# Patient Record
Sex: Male | Born: 1940
Health system: Southern US, Community
[De-identification: ages and names within clinical notes are randomized; demographics above are authoritative.]

## PROBLEM LIST (undated history)

## (undated) DIAGNOSIS — E785 Hyperlipidemia, unspecified: Secondary | ICD-10-CM

## (undated) DIAGNOSIS — I10 Essential (primary) hypertension: Secondary | ICD-10-CM

## (undated) DIAGNOSIS — K644 Residual hemorrhoidal skin tags: Secondary | ICD-10-CM

## (undated) DIAGNOSIS — K222 Esophageal obstruction: Secondary | ICD-10-CM

## (undated) DIAGNOSIS — M1990 Unspecified osteoarthritis, unspecified site: Secondary | ICD-10-CM

## (undated) DIAGNOSIS — K219 Gastro-esophageal reflux disease without esophagitis: Secondary | ICD-10-CM

## (undated) DIAGNOSIS — K429 Umbilical hernia without obstruction or gangrene: Secondary | ICD-10-CM

## (undated) DIAGNOSIS — I2510 Atherosclerotic heart disease of native coronary artery without angina pectoris: Secondary | ICD-10-CM

## (undated) DIAGNOSIS — Z87891 Personal history of nicotine dependence: Secondary | ICD-10-CM

## (undated) DIAGNOSIS — Z7289 Other problems related to lifestyle: Secondary | ICD-10-CM

## (undated) DIAGNOSIS — E878 Other disorders of electrolyte and fluid balance, not elsewhere classified: Secondary | ICD-10-CM

## (undated) DIAGNOSIS — K2270 Barrett's esophagus without dysplasia: Secondary | ICD-10-CM

## (undated) HISTORY — DX: Umbilical hernia without obstruction or gangrene: K42.9

## (undated) HISTORY — DX: Essential (primary) hypertension: I10

## (undated) HISTORY — DX: Residual hemorrhoidal skin tags: K64.4

## (undated) HISTORY — PX: TONSILLECTOMY: SUR1361

## (undated) HISTORY — PX: APPENDECTOMY: SHX54

## (undated) HISTORY — DX: Other disorders of electrolyte and fluid balance, not elsewhere classified: E87.8

## (undated) HISTORY — DX: Other problems related to lifestyle: Z72.89

## (undated) HISTORY — DX: Hyperlipidemia, unspecified: E78.5

## (undated) HISTORY — DX: Atherosclerotic heart disease of native coronary artery without angina pectoris: I25.10

## (undated) HISTORY — DX: Personal history of nicotine dependence: Z87.891

## (undated) HISTORY — DX: Unspecified osteoarthritis, unspecified site: M19.90

## (undated) HISTORY — DX: Gastro-esophageal reflux disease without esophagitis: K21.9

## (undated) HISTORY — DX: Barrett's esophagus without dysplasia: K22.70

## (undated) HISTORY — DX: Esophageal obstruction: K22.2

---

## 2001-08-10 ENCOUNTER — Ambulatory Visit (HOSPITAL_COMMUNITY): Admission: RE | Admit: 2001-08-10 | Discharge: 2001-08-10 | Payer: Self-pay | Admitting: *Deleted

## 2001-08-10 ENCOUNTER — Encounter: Payer: Self-pay | Admitting: Cardiology

## 2003-09-28 HISTORY — PX: COLONOSCOPY: SHX174

## 2004-01-24 ENCOUNTER — Ambulatory Visit (HOSPITAL_COMMUNITY): Admission: RE | Admit: 2004-01-24 | Discharge: 2004-01-24 | Payer: Self-pay | Admitting: Internal Medicine

## 2006-02-28 ENCOUNTER — Encounter: Payer: Self-pay | Admitting: Cardiology

## 2006-09-27 HISTORY — PX: NECK SURGERY: SHX720

## 2007-04-12 ENCOUNTER — Encounter: Payer: Self-pay | Admitting: Physician Assistant

## 2007-04-12 ENCOUNTER — Ambulatory Visit: Payer: Self-pay | Admitting: Cardiology

## 2007-04-13 ENCOUNTER — Ambulatory Visit: Payer: Self-pay

## 2007-04-13 ENCOUNTER — Encounter: Payer: Self-pay | Admitting: Cardiology

## 2007-05-02 ENCOUNTER — Encounter: Payer: Self-pay | Admitting: Cardiology

## 2008-01-01 ENCOUNTER — Encounter: Payer: Self-pay | Admitting: Physician Assistant

## 2008-01-26 ENCOUNTER — Ambulatory Visit: Payer: Self-pay | Admitting: Internal Medicine

## 2008-01-26 DIAGNOSIS — K2270 Barrett's esophagus without dysplasia: Secondary | ICD-10-CM

## 2008-01-26 DIAGNOSIS — K222 Esophageal obstruction: Secondary | ICD-10-CM

## 2008-01-26 HISTORY — DX: Barrett's esophagus without dysplasia: K22.70

## 2008-01-26 HISTORY — DX: Esophageal obstruction: K22.2

## 2008-02-06 ENCOUNTER — Encounter: Payer: Self-pay | Admitting: Internal Medicine

## 2008-02-06 ENCOUNTER — Ambulatory Visit (HOSPITAL_COMMUNITY): Admission: RE | Admit: 2008-02-06 | Discharge: 2008-02-06 | Payer: Self-pay | Admitting: Internal Medicine

## 2008-02-06 ENCOUNTER — Ambulatory Visit: Payer: Self-pay | Admitting: Internal Medicine

## 2008-02-06 HISTORY — PX: ESOPHAGOGASTRODUODENOSCOPY: SHX1529

## 2008-02-29 ENCOUNTER — Ambulatory Visit: Payer: Self-pay | Admitting: Cardiology

## 2008-10-16 ENCOUNTER — Encounter: Payer: Self-pay | Admitting: Cardiology

## 2009-03-19 DIAGNOSIS — I10 Essential (primary) hypertension: Secondary | ICD-10-CM | POA: Insufficient documentation

## 2009-03-19 DIAGNOSIS — Z87891 Personal history of nicotine dependence: Secondary | ICD-10-CM

## 2009-03-19 DIAGNOSIS — F1010 Alcohol abuse, uncomplicated: Secondary | ICD-10-CM | POA: Insufficient documentation

## 2009-03-19 DIAGNOSIS — K644 Residual hemorrhoidal skin tags: Secondary | ICD-10-CM | POA: Insufficient documentation

## 2009-03-19 DIAGNOSIS — M1990 Unspecified osteoarthritis, unspecified site: Secondary | ICD-10-CM | POA: Insufficient documentation

## 2009-03-19 DIAGNOSIS — E7800 Pure hypercholesterolemia, unspecified: Secondary | ICD-10-CM | POA: Insufficient documentation

## 2009-03-19 DIAGNOSIS — K429 Umbilical hernia without obstruction or gangrene: Secondary | ICD-10-CM | POA: Insufficient documentation

## 2009-03-20 ENCOUNTER — Ambulatory Visit: Payer: Self-pay | Admitting: Gastroenterology

## 2009-03-20 DIAGNOSIS — K219 Gastro-esophageal reflux disease without esophagitis: Secondary | ICD-10-CM | POA: Insufficient documentation

## 2009-03-20 DIAGNOSIS — Z8719 Personal history of other diseases of the digestive system: Secondary | ICD-10-CM

## 2009-03-20 DIAGNOSIS — K2270 Barrett's esophagus without dysplasia: Secondary | ICD-10-CM

## 2009-03-21 ENCOUNTER — Encounter: Payer: Self-pay | Admitting: Internal Medicine

## 2009-03-27 HISTORY — PX: ESOPHAGOGASTRODUODENOSCOPY: SHX1529

## 2009-04-16 ENCOUNTER — Encounter: Payer: Self-pay | Admitting: Internal Medicine

## 2009-04-21 ENCOUNTER — Encounter: Payer: Self-pay | Admitting: Internal Medicine

## 2009-04-21 ENCOUNTER — Ambulatory Visit: Payer: Self-pay | Admitting: Internal Medicine

## 2009-04-21 ENCOUNTER — Ambulatory Visit (HOSPITAL_COMMUNITY): Admission: RE | Admit: 2009-04-21 | Discharge: 2009-04-21 | Payer: Self-pay | Admitting: Internal Medicine

## 2009-04-27 ENCOUNTER — Encounter: Payer: Self-pay | Admitting: Internal Medicine

## 2009-05-27 DIAGNOSIS — I2510 Atherosclerotic heart disease of native coronary artery without angina pectoris: Secondary | ICD-10-CM

## 2009-06-04 ENCOUNTER — Ambulatory Visit: Payer: Self-pay | Admitting: Cardiology

## 2009-06-04 DIAGNOSIS — I739 Peripheral vascular disease, unspecified: Secondary | ICD-10-CM | POA: Insufficient documentation

## 2009-09-27 HISTORY — PX: KNEE SURGERY: SHX244

## 2009-10-09 ENCOUNTER — Encounter: Payer: Self-pay | Admitting: Cardiology

## 2009-10-10 ENCOUNTER — Encounter (INDEPENDENT_AMBULATORY_CARE_PROVIDER_SITE_OTHER): Payer: Self-pay | Admitting: *Deleted

## 2009-10-13 ENCOUNTER — Inpatient Hospital Stay (HOSPITAL_COMMUNITY): Admission: RE | Admit: 2009-10-13 | Discharge: 2009-10-16 | Payer: Self-pay | Admitting: Specialist

## 2009-10-13 ENCOUNTER — Encounter (INDEPENDENT_AMBULATORY_CARE_PROVIDER_SITE_OTHER): Payer: Self-pay | Admitting: *Deleted

## 2009-10-13 IMAGING — CR DG KNEE 1-2V PORT*L*
2 series · 2 of 2 positions shown · non-contrast
Comparison: Portable exam [LH] hours compared to [DATE]

CLINICAL DATA: Degenerative joint disease left knee status post
knee replacement

PORTABLE LEFT KNEE - 1-2 VIEW

[view not recorded (1 of 2)]
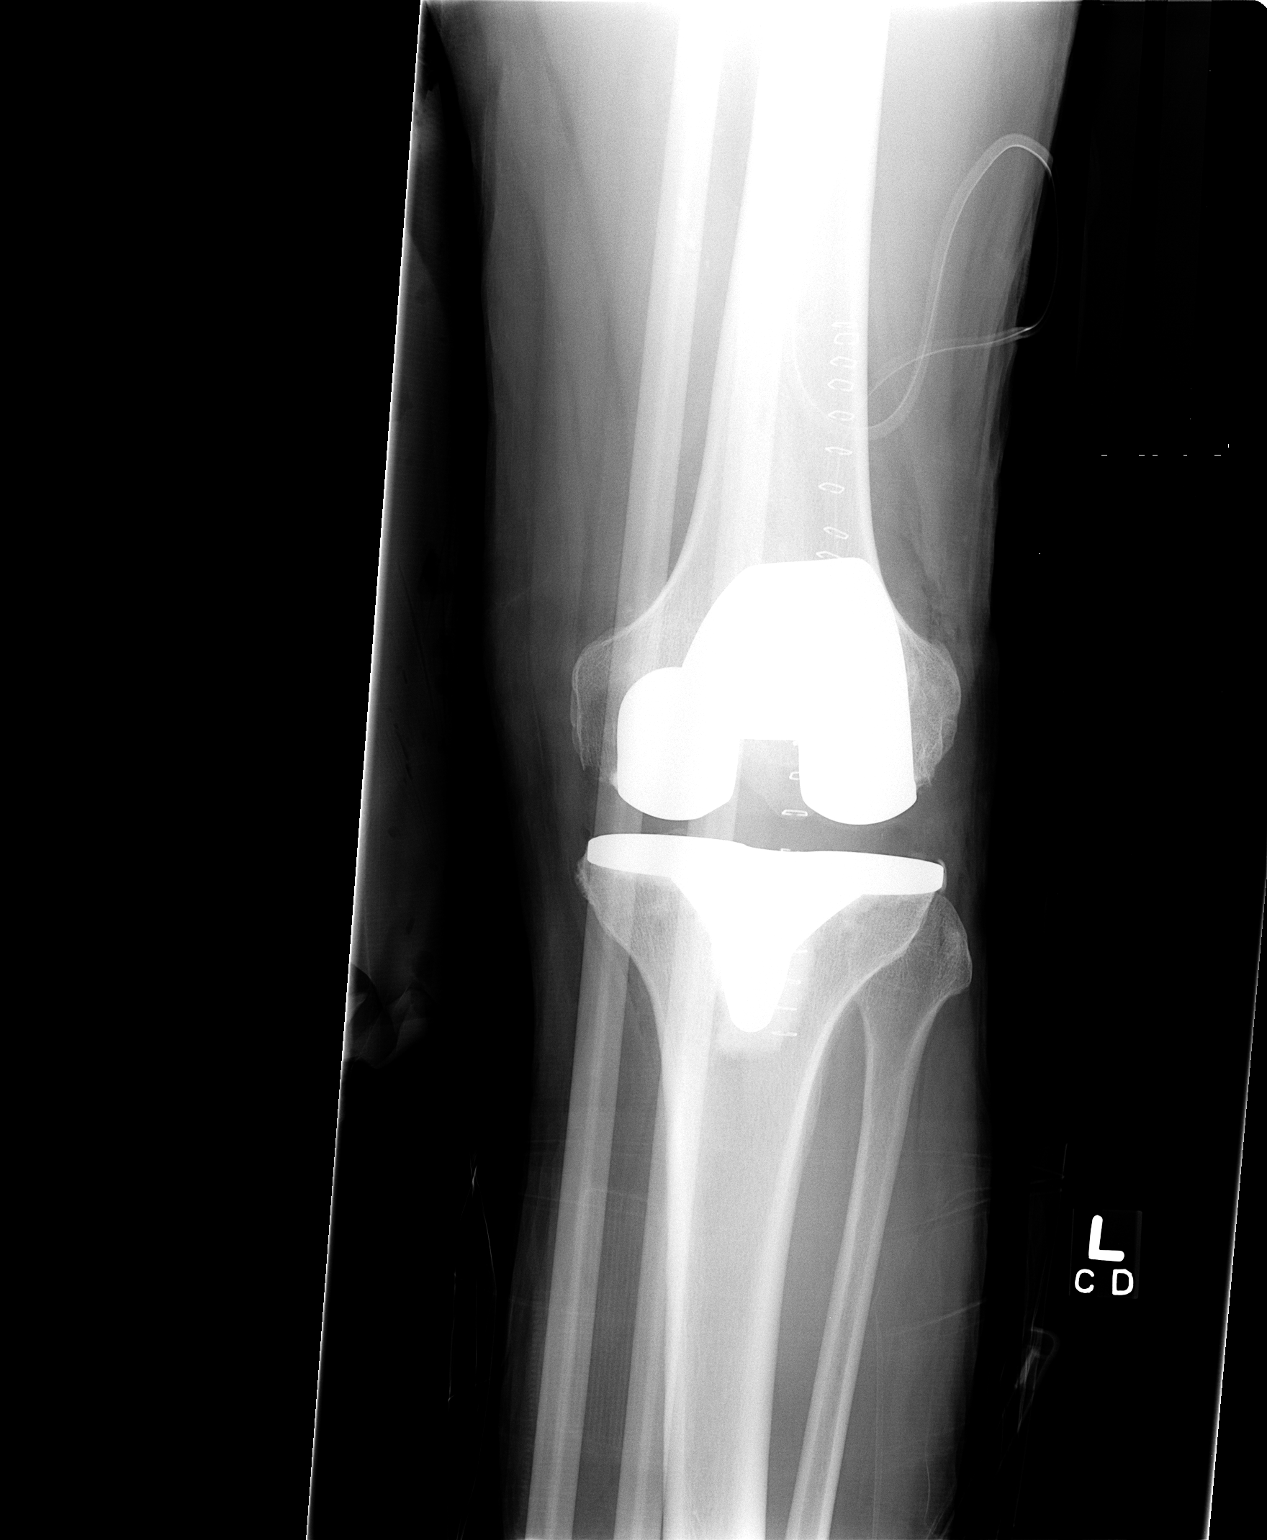

[view not recorded (2 of 2)]
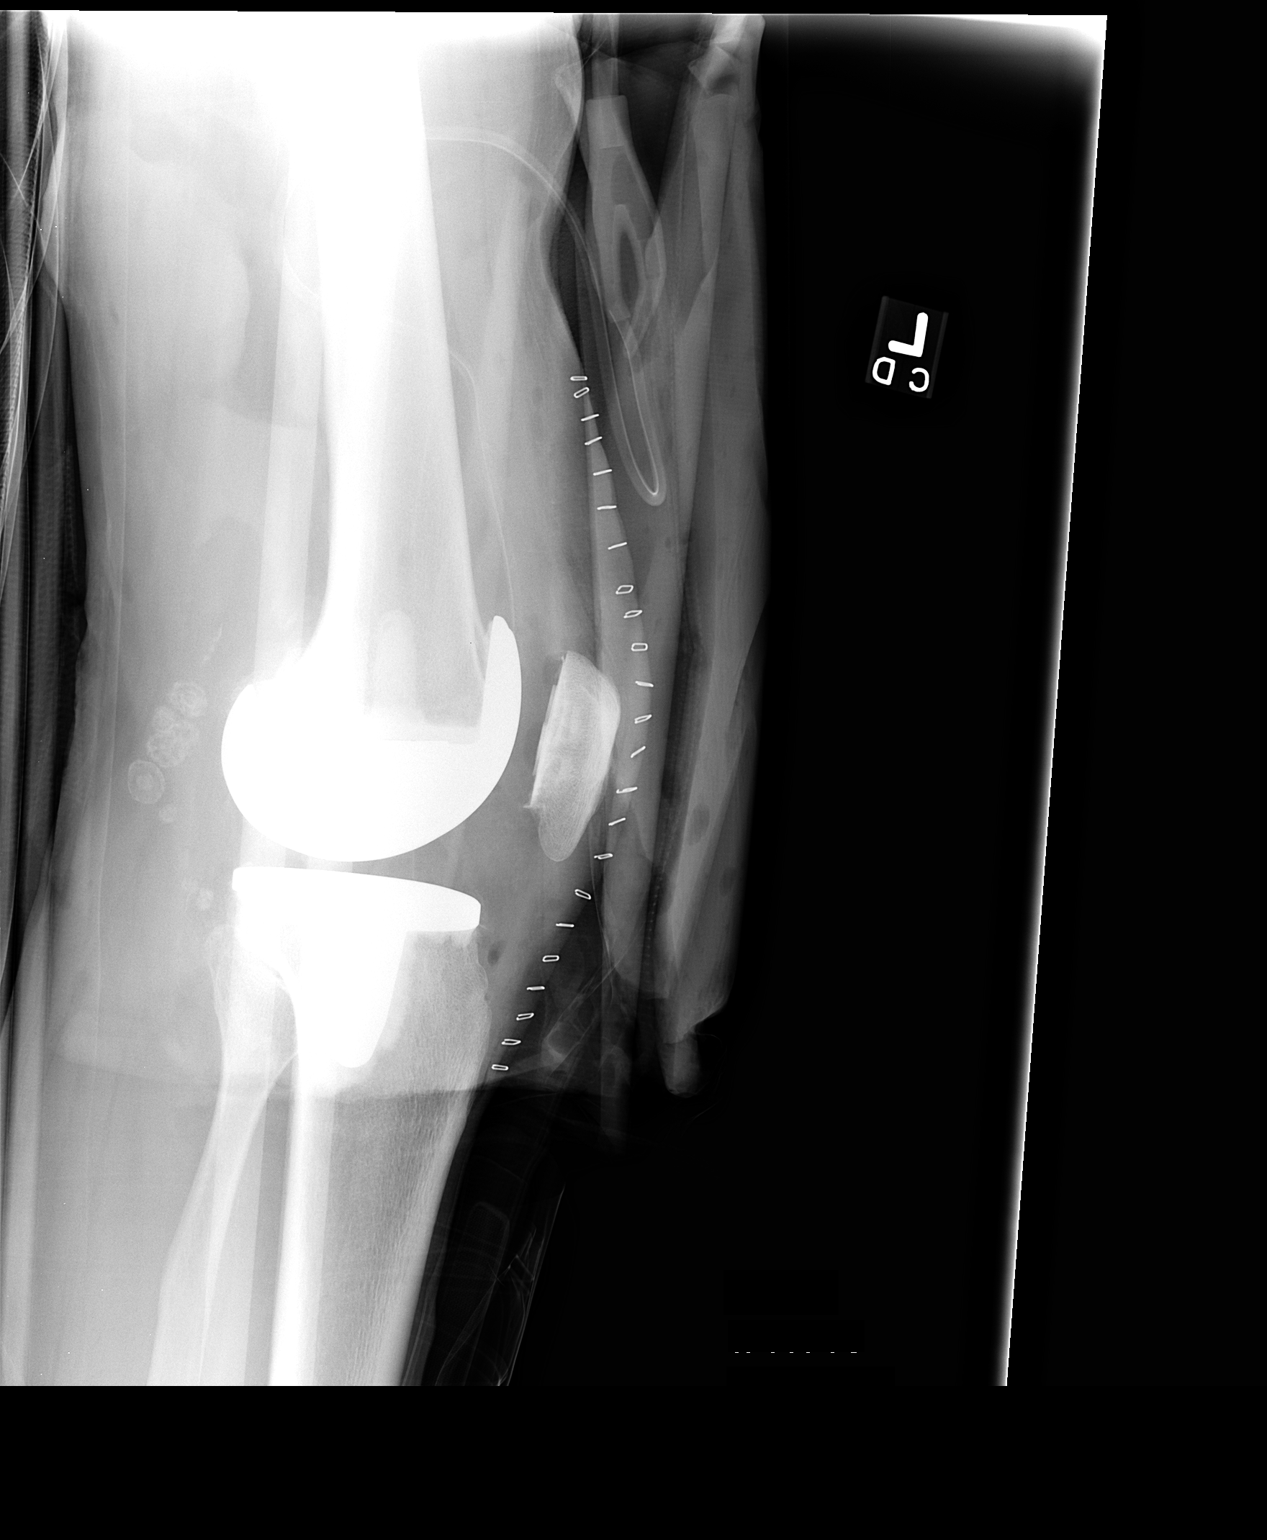

[2 of 2 positions shown; findings below may reference images not displayed]

FINDINGS: Interval resection of articular components of left knee with
placement of left knee prosthesis.
Expected soft tissue changes and anterior surgical drain.
Prosthetic components in expected positions without fracture or
subluxation.
Calcified loose bodies posterior to left knee.
Bones appear mildly demineralized diffusely.
IMPRESSION: Left knee prosthesis without acute complication.
Calcified loose bodies posterior to the left knee.

## 2010-07-15 ENCOUNTER — Encounter (INDEPENDENT_AMBULATORY_CARE_PROVIDER_SITE_OTHER): Payer: Self-pay | Admitting: *Deleted

## 2010-07-15 ENCOUNTER — Ambulatory Visit: Payer: Self-pay | Admitting: Cardiology

## 2010-07-23 ENCOUNTER — Ambulatory Visit: Payer: Self-pay | Admitting: Cardiology

## 2010-07-28 ENCOUNTER — Telehealth (INDEPENDENT_AMBULATORY_CARE_PROVIDER_SITE_OTHER): Payer: Self-pay | Admitting: *Deleted

## 2010-07-29 ENCOUNTER — Encounter (INDEPENDENT_AMBULATORY_CARE_PROVIDER_SITE_OTHER): Payer: Self-pay | Admitting: *Deleted

## 2010-08-06 ENCOUNTER — Inpatient Hospital Stay (HOSPITAL_COMMUNITY): Admission: RE | Admit: 2010-08-06 | Discharge: 2010-08-09 | Payer: Self-pay | Admitting: Specialist

## 2010-10-27 NOTE — Op Note (Signed)
Summary: Operative Report  Operative Report   Imported By: Zachary George 07/15/2010 13:28:30  _____________________________________________________________________  External Attachment:    Type:   Image     Comment:   External Document

## 2010-10-27 NOTE — Letter (Signed)
Summary: Engineer, materials at [facility]  [address]   [place], [place] [address]   Phone: [phone]  Fax: [phone]        July 29, 2010 MRN: [id number]   JAHKI WITHAM [address] [place], [place]  [address]   Dear Mr. Yono,  Your test ordered by Selena Batten has been reviewed by your physician (or physician assistant) and was found to be normal or stable. Your physician (or physician assistant) felt no changes were needed at this time.  ____ Echocardiogram  __X__ Cardiac Stress Test  ____ Lab Work  ____ Peripheral vascular study of arms, legs or neck  ____ CT scan or X-ray  ____ Lung or Breathing test  ____ Other:   Thank you.   Hoover Brunette, LPN    Duane Boston, M.D., F.A.C.C. Thressa Sheller, M.D., F.A.C.C. Oneal Grout, M.D., F.A.C.C. Cheree Ditto, M.D., F.A.C.C. Daiva Nakayama, M.D., F.A.C.C. Kenney Houseman, M.D., F.A.C.C. Jeanne Ivan, PA-C

## 2010-10-27 NOTE — Cardiovascular Report (Signed)
Summary: Cardiac Catheterization  Cardiac Catheterization   Imported By: Dorise Hiss 07/15/2010 08:18:43  _____________________________________________________________________  External Attachment:    Type:   Image     Comment:   External Document

## 2010-10-27 NOTE — Letter (Signed)
Summary: Pharmacist, community at [facility]. [address]   [place], [place] [address]   Phone: [phone]  Fax: [phone]      [facility] Cardiovascular Services  Cardiolite Stress Test     [facility]  Your doctor has ordered a Cardiolite Stress Test to help determine the condition of your heart during stress. If you take blood pressure medicine, ask your doctor if you should take it the day of your test. You should not have anything to eat or drink at least 4 hours before your test is scheduled, and no caffeine (coffee, tea, decaf. or chocolate) for 24 hours before your test.   You will need to register at the Outpatient/Main Entrance at the hospital 30 minutes before your appointment time. It is a good idea to bring a copy of your order with you. They will direct you to the Diagnostic Imaging (Radiology) Department.  You will be asked to undress from the waist up and be given a hospital gown to wear, so dress comfortably from the waist down, for example:    Sweat pants, shorts or skirt   Rubber-soled lace up shoes (i.e. tennis shoes)  Plan on about three hours from registration to release from the hospital.    Date of Test:              Time of Test

## 2010-10-27 NOTE — Progress Notes (Signed)
Summary: Records Request  Faxed OV, EKG & Stress to [place] at [facility] ([phone]). Debby Freiberg  July 28, 2010 12:13 PM

## 2010-10-27 NOTE — Letter (Signed)
Summary: External Correspondence/ FAXED PRE-ADMISSION  External Correspondence/ FAXED PRE-ADMISSION   Imported By: Dorise Hiss 10/09/2009 15:02:27  _____________________________________________________________________  External Attachment:    Type:   Image     Comment:   External Document

## 2010-10-27 NOTE — Assessment & Plan Note (Signed)
Summary: 6 MO FU OER SEPT REMINDER   Visit Type:  Follow-up Primary Provider:  Sherril Croon   History of Present Illness: the patient is a 70 year old male with a history of single-vessel coronary arteries with a 40% left main lesion. The patient had a stress test in 2008 which was negative for ischemia. He presents for followup. He remains asymptomatic. He reports no chest pain shortness of breath orthopnea PND. His blood pressure slightly elevated in the office but states it is otherwise normal. He has blood work done by his primary care physician. He scheduled for knee surgery November 10. From a cardiac standpoint is otherwise doing quite well.  Preventive Screening-Counseling & Management  Alcohol-Tobacco     Smoking Status: quit     Year Quit: 1989  Current Medications (verified): 1)  Norvasc 10 Mg Tabs (Amlodipine Besylate) .... Once Daily 2)  Diovan Hct 80-12.5 Mg Tabs (Valsartan-Hydrochlorothiazide) .... Once Daily 3)  Mobic 7.5 Mg Tabs (Meloxicam) .... Two Times A Day 4)  Aspirin 81 Mg Tabs (Aspirin) .... Take 1 Tablet By Mouth Once A Day 5)  Fish Oil Concentrate 1000 Mg Caps (Omega-3 Fatty Acids) .... Take 1 Tablet By Mouth Two Times A Day 6)  Simvastatin 20 Mg Tabs (Simvastatin) .... Take 1 Tablet By Mouth Once A Day 7)  Omeprazole 20 Mg Tbec (Omeprazole) .... Once Daily 8)  Allegra 180 Mg Tabs (Fexofenadine Hcl) .... As Needed  Allergies (verified): No Known Drug Allergies  Comments:  Nurse/Medical Assistant: The patient's medication list and allergies were reviewed with the patient and were updated in the Medication and Allergy Lists.  Past History:  Past Medical History: Last updated: 05/27/2009 Arthritis Barrett's esophagus first dx on EGD 5/09. H/o Schatzki ring s/p dilation 5/09. Umbilical hernia TCS, Dr. Karilyn Cota, 2005, hyperplastic polyps, hemorrhoids CAD, NATIVE VESSEL (ICD-414.01) HYPERCHOLESTEROLEMIA (ICD-272.0) HYPERTENSION (ICD-401.9) GERD  (ICD-530.81) SCHATZKI'S RING, HX OF (ICD-V12.79) BARRETTS ESOPHAGUS (ICD-530.85) ALCOHOL USE (ICD-305.00) TOBACCO USE, QUIT (ICD-V15.82) EXTERNAL HEMORRHOIDS (ICD-455.3) UMBILICAL HERNIA (ICD-553.1) OSTEOARTHRITIS (ICD-715.90)  Past Surgical History: Last updated: April 19, 2009 APPENDECTOMY TONSILLECTOMY NECK SURGERY IN 2008, plate  Family History: Last updated: 04-19-2009 Father: deceased, age 2, pancreatic cancer Mother: age 21 Siblings: Brother, deceased, MI No FH of Colon Cancer:  Social History: Last updated: 2009/04/19 Marital Status: Married Children: 3 Occupation: Retired Nurse, 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 Consumes alcohol some on weekends.  Risk Factors: Smoking Status: quit (07/15/2010)  Social History: Smoking Status:  quit  Review of Systems  The patient denies anorexia, fever, weight loss, weight gain, vision loss, decreased hearing, hoarseness, chest pain, syncope, dyspnea on exertion, peripheral edema, prolonged cough, headaches, hemoptysis, abdominal pain, melena, hematochezia, severe indigestion/heartburn, hematuria, incontinence, genital sores, muscle weakness, suspicious skin lesions, transient blindness, difficulty walking, depression, unusual weight change, abnormal bleeding, enlarged lymph nodes, angioedema, breast masses, and testicular masses.    Vital Signs:  Patient profile:   70 year old male Height:      71 inches Weight:      193 pounds BMI:     27.02 Pulse rate:   79 / minute BP sitting:   159 / 76  (left arm) Cuff size:   regular  Vitals Entered By: Carlye Grippe (July 15, 2010 2:27 PM)  Nutrition Counseling: Patient's BMI is greater than 25 and therefore counseled on weight management options.  Physical Exam  Additional Exam:  General: Well-developed, well-nourished in no distress head: Normocephalic and atraumatic eyes PERRLA/EOMI intact, conjunctiva and lids normal nose: No deformity or lesions mouth  normal dentition,  normal posterior pharynx neck: Supple, no JVD.  No masses, thyromegaly or abnormal cervical nodes lungs: Normal breath sounds bilaterally without wheezing.  Normal percussion heart: regular rate and rhythm with normal S1 and S2, no S3 or S4.  PMI is normal.  No pathological murmurs abdomen: Normal bowel sounds, abdomen is soft and nontender without masses, organomegaly or hernias noted.  No hepatosplenomegaly musculoskeletal: Back normal, normal gait muscle strength and tone normal pulsus: Pulse is normal in all 4 extremities Extremities: No peripheral pitting edema neurologic: Alert and oriented x 3 skin: Intact without lesions or rashes cervical nodes: No significant adenopathy psychologic: Normal affect    Impression & Recommendations:  Problem # 1:  CAD, NATIVE VESSEL (ICD-414.01) the patient has single-vessel coronary artery disease we'll proceed with a Cardiolite study prior to surgery he is due for a stress test after 3 years anyway. His updated medication list for this problem includes:    Norvasc 10 Mg Tabs (Amlodipine besylate) ..... Once daily    Aspirin 81 Mg Tabs (Aspirin) .Marland Kitchen... Take 1 tablet by mouth once a day  Orders: EKG w/ Interpretation (93000) Nuclear Med (Nuc Med)  Problem # 2:  HYPERCHOLESTEROLEMIA (ICD-272.0) follow by his primary care physician. His updated medication list for this problem includes:    Simvastatin 20 Mg Tabs (Simvastatin) .Marland Kitchen... Take 1 tablet by mouth once a day  Problem # 3:  HYPERTENSION (ICD-401.9) blood pressure is elevated in the office the patient assures me that its normal at home. His updated medication list for this problem includes:    Norvasc 10 Mg Tabs (Amlodipine besylate) ..... Once daily    Diovan Hct 80-12.5 Mg Tabs (Valsartan-hydrochlorothiazide) ..... Once daily    Aspirin 81 Mg Tabs (Aspirin) .Marland Kitchen... Take 1 tablet by mouth once a day  Patient Instructions: 1)  Exercise Cardiolite  2)  Follow up in  1 year

## 2010-12-08 LAB — CBC
HCT: 30.7 % — ABNORMAL LOW (ref 39.0–52.0)
HCT: 31.1 % — ABNORMAL LOW (ref 39.0–52.0)
HCT: 32.2 % — ABNORMAL LOW (ref 39.0–52.0)
Hemoglobin: 10.8 g/dL — ABNORMAL LOW (ref 13.0–17.0)
Hemoglobin: 11.2 g/dL — ABNORMAL LOW (ref 13.0–17.0)
Hemoglobin: 15.3 g/dL (ref 13.0–17.0)
MCH: 31.3 pg (ref 26.0–34.0)
MCH: 31.3 pg (ref 26.0–34.0)
MCH: 31.8 pg (ref 26.0–34.0)
MCHC: 34.6 g/dL (ref 30.0–36.0)
MCHC: 35.3 g/dL (ref 30.0–36.0)
MCV: 89.7 fL (ref 78.0–100.0)
MCV: 89.9 fL (ref 78.0–100.0)
MCV: 90.5 fL (ref 78.0–100.0)
Platelets: 188 10*3/uL (ref 150–400)
RBC: 4.87 MIL/uL (ref 4.22–5.81)
RDW: 13.1 % (ref 11.5–15.5)
RDW: 13.1 % (ref 11.5–15.5)
RDW: 13.1 % (ref 11.5–15.5)
WBC: 10.2 10*3/uL (ref 4.0–10.5)
WBC: 9.3 10*3/uL (ref 4.0–10.5)

## 2010-12-08 LAB — BASIC METABOLIC PANEL
BUN: 11 mg/dL (ref 6–23)
BUN: 7 mg/dL (ref 6–23)
BUN: 9 mg/dL (ref 6–23)
CO2: 30 mEq/L (ref 19–32)
Calcium: 8.7 mg/dL (ref 8.4–10.5)
Chloride: 103 mEq/L (ref 96–112)
Chloride: 99 mEq/L (ref 96–112)
Creatinine, Ser: 0.7 mg/dL (ref 0.4–1.5)
GFR calc non Af Amer: >60 mL/min (ref >60)
Glucose, Bld: 128 mg/dL — ABNORMAL HIGH (ref 70–99)
Glucose, Bld: 132 mg/dL — ABNORMAL HIGH (ref 70–99)
Potassium: 3.6 mEq/L (ref 3.5–5.1)
Potassium: 4.6 mEq/L (ref 3.5–5.1)

## 2010-12-08 LAB — URINALYSIS, ROUTINE W REFLEX MICROSCOPIC
Bilirubin Urine: NEGATIVE
Glucose, UA: NEGATIVE mg/dL
Hgb urine dipstick: NEGATIVE
Ketones, ur: NEGATIVE mg/dL
Protein, ur: NEGATIVE mg/dL
pH: 7.5 (ref 5.0–8.0)

## 2010-12-08 LAB — DIFFERENTIAL
Basophils Absolute: 0 10*3/uL (ref 0.0–0.1)
Basophils Relative: 0 % (ref 0–1)
Eosinophils Absolute: 0.2 10*3/uL (ref 0.0–0.7)
Eosinophils Relative: 2 % (ref 0–5)
Lymphocytes Relative: 30 % (ref 12–46)

## 2010-12-08 LAB — PROTIME-INR
INR: 1.11 (ref 0.00–1.49)
Prothrombin Time: 13.8 seconds (ref 11.6–15.2)
Prothrombin Time: 14.5 seconds (ref 11.6–15.2)

## 2010-12-08 LAB — COMPREHENSIVE METABOLIC PANEL
ALT: 18 U/L (ref 0–53)
AST: 26 U/L (ref 0–37)
Alkaline Phosphatase: 66 U/L (ref 39–117)
CO2: 31 mEq/L (ref 19–32)
Chloride: 102 mEq/L (ref 96–112)
Creatinine, Ser: 0.75 mg/dL (ref 0.4–1.5)
GFR calc Af Amer: >60 mL/min (ref >60)
GFR calc non Af Amer: >60 mL/min (ref >60)
Potassium: 4.4 mEq/L (ref 3.5–5.1)
Total Bilirubin: 1.1 mg/dL (ref 0.3–1.2)

## 2010-12-08 LAB — TYPE AND SCREEN
ABO/RH(D): O POS
Antibody Screen: NEGATIVE

## 2010-12-08 LAB — MRSA CULTURE

## 2010-12-13 LAB — PROTIME-INR: INR: 1.02 (ref 0.00–1.49)

## 2010-12-13 LAB — COMPREHENSIVE METABOLIC PANEL
ALT: 24 U/L (ref 0–53)
Alkaline Phosphatase: 79 U/L (ref 39–117)
BUN: 12 mg/dL (ref 6–23)
CO2: 28 mEq/L (ref 19–32)
Chloride: 102 mEq/L (ref 96–112)
Glucose, Bld: 100 mg/dL — ABNORMAL HIGH (ref 70–99)
Potassium: 3.9 mEq/L (ref 3.5–5.1)
Sodium: 138 mEq/L (ref 135–145)
Total Bilirubin: 0.7 mg/dL (ref 0.3–1.2)

## 2010-12-13 LAB — DIFFERENTIAL
Basophils Absolute: 0 10*3/uL (ref 0.0–0.1)
Basophils Relative: 0 % (ref 0–1)
Eosinophils Absolute: 0.2 10*3/uL (ref 0.0–0.7)
Neutro Abs: 5.6 10*3/uL (ref 1.7–7.7)
Neutrophils Relative %: 67 % (ref 43–77)

## 2010-12-13 LAB — APTT: aPTT: 32 seconds (ref 24–37)

## 2010-12-13 LAB — CBC
HCT: 44.8 % (ref 39.0–52.0)
Hemoglobin: 15.4 g/dL (ref 13.0–17.0)
RBC: 5 MIL/uL (ref 4.22–5.81)
WBC: 8.3 10*3/uL (ref 4.0–10.5)

## 2010-12-13 LAB — TYPE AND SCREEN

## 2010-12-13 LAB — URINALYSIS, ROUTINE W REFLEX MICROSCOPIC
Glucose, UA: NEGATIVE mg/dL
Ketones, ur: NEGATIVE mg/dL
Specific Gravity, Urine: 1.009 (ref 1.005–1.030)
pH: 7 (ref 5.0–8.0)

## 2010-12-13 LAB — ABO/RH: ABO/RH(D): O POS

## 2010-12-14 LAB — CBC
HCT: 33 % — ABNORMAL LOW (ref 39.0–52.0)
HCT: 34.9 % — ABNORMAL LOW (ref 39.0–52.0)
Hemoglobin: 11.4 g/dL — ABNORMAL LOW (ref 13.0–17.0)
Hemoglobin: 11.4 g/dL — ABNORMAL LOW (ref 13.0–17.0)
Hemoglobin: 11.9 g/dL — ABNORMAL LOW (ref 13.0–17.0)
MCHC: 34.5 g/dL (ref 30.0–36.0)
MCV: 88.8 fL (ref 78.0–100.0)
MCV: 89.3 fL (ref 78.0–100.0)
Platelets: 196 10*3/uL (ref 150–400)
RBC: 3.69 MIL/uL — ABNORMAL LOW (ref 4.22–5.81)
RBC: 3.74 MIL/uL — ABNORMAL LOW (ref 4.22–5.81)
RDW: 12.6 % (ref 11.5–15.5)
RDW: 12.9 % (ref 11.5–15.5)
WBC: 9.1 10*3/uL (ref 4.0–10.5)
WBC: 9.3 10*3/uL (ref 4.0–10.5)

## 2010-12-14 LAB — BASIC METABOLIC PANEL
BUN: 11 mg/dL (ref 6–23)
BUN: 7 mg/dL (ref 6–23)
CO2: 30 mEq/L (ref 19–32)
CO2: 30 mEq/L (ref 19–32)
Calcium: 8.6 mg/dL (ref 8.4–10.5)
Chloride: 102 mEq/L (ref 96–112)
Chloride: 98 mEq/L (ref 96–112)
Chloride: 98 mEq/L (ref 96–112)
Creatinine, Ser: 0.7 mg/dL (ref 0.4–1.5)
GFR calc Af Amer: >60 mL/min (ref >60)
GFR calc non Af Amer: >60 mL/min (ref >60)
Glucose, Bld: 122 mg/dL — ABNORMAL HIGH (ref 70–99)
Glucose, Bld: 135 mg/dL — ABNORMAL HIGH (ref 70–99)
Glucose, Bld: 176 mg/dL — ABNORMAL HIGH (ref 70–99)
Potassium: 3 mEq/L — ABNORMAL LOW (ref 3.5–5.1)
Potassium: 3.9 mEq/L (ref 3.5–5.1)
Potassium: 4.1 mEq/L (ref 3.5–5.1)
Sodium: 135 mEq/L (ref 135–145)
Sodium: 137 mEq/L (ref 135–145)

## 2010-12-14 LAB — PROTIME-INR
INR: 1.34 (ref 0.00–1.49)
INR: 1.48 (ref 0.00–1.49)

## 2011-02-09 NOTE — Op Note (Signed)
NAME:  Todd Nelson, Todd Nelson                ACCOUNT NO.:  [id number]   MEDICAL RECORD NO.:  [id number]          PATIENT TYPE:  AMB   LOCATION:  DAY                           FACILITY:  [facility]   PHYSICIAN:  R. Roetta Sessions, M.D. DATE OF BIRTH:  [birth year]   DATE OF PROCEDURE:  04/21/2009  DATE OF DISCHARGE:                               OPERATIVE REPORT   Esophagogastroduodenoscopy with biopsy.   INDICATIONS FOR PROCEDURE:  The patient is a 70 year old gentleman with  longstanding gastroesophageal reflux disease symptoms, well-controlled  on omeprazole 20 mg orally daily who was found to have short segment  Barrett's esophagus last year.  He is here for 1 year follow-up.  He  also Schatzki's ring last year and underwent Maloney dilation.  He tells  me his dysphagia is much better although, from time to time he does have  some difficulty sensing that meat slows down as it goes by the GE  junction.  I told him we would look at his esophagus again today,  perform surveillance biopsies, we may or may not dilate his esophagus  depending on what is found.  The risks, benefits, alternatives and  limitations have been discussed and questions answered.  Please see the  documentation in the medical record.   PROCEDURE NOTE:  O2 saturation, blood pressure, pulse, respiration and  blood pressure monitored throughout the entire procedure.  Conscious  sedation:  Versed 3 mg IV Demerol 75 mg IV in divided doses.  Instrument:  Pentax video chip system.   FINDINGS:  Examination of the tubular esophagus revealed what appeared  to more of a accentuating undulating Z-line.  I really saw no more than  about 5 mm of salmon-colored epithelium coming up above the GE junction.  On opposite walls, it appeared to be less impressive than what was seen  a year ago.  There was no esophagitis.  No evidence of neoplasm.  EG  junction easily traversed.  There was no Schatzki's ring apparent.  Stomach:  The gastric cavity  insufflated well with air.  Thorough  examination of the gastric mucosa including retroflexion of the proximal  stomach and esophagogastric junction demonstrated only a small hiatal  hernia and a couple of tiny antral erosions.  The pylorus was patent and  easily traversed.  The bulb and second portion revealed no  abnormalities.  Therapeutic/diagnostic maneuvers were performed:  Biopsies of the salmon-colored epithelium in the distal esophagus was  taken for histologic study.  The patient tolerated the procedure well  and was reactive after endoscopy.   IMPRESSION:  1. 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  Status post biopsies today, otherwise      unremarkable esophagus.  2. A small hiatal hernia and tiny antral erosions otherwise, normal      stomach, patent pylorus normal duodenum one and duodenum two.   RECOMMENDATIONS:  1. Continue omeprazole 20 mg orally daily.  2. Follow-up on path.  3. Further recommendations to follow.      [name], M.D.  Electronically Signed     RMR/MEDQ  D:  04/21/2009  T:  04/21/2009  Job:  [id number]   cc:   Doreen Beam, MD  Fax: [phone]

## 2011-02-09 NOTE — Consult Note (Signed)
NAME:  Todd Nelson, Todd Nelson              ACCOUNT NO.:  [id number]   MEDICAL RECORD NO.:  [id number]          PATIENT TYPE:  END   LOCATION:  DAY                           FACILITY:  [facility]   PHYSICIAN:  R. Roetta Sessions, M.D. DATE OF BIRTH:  1940-12-04   DATE OF CONSULTATION:  01/26/2008  DATE OF DISCHARGE:                                 CONSULTATION   REASON FOR CONSULTATION:  Difficulty swallowing.   PHYSICIAN REQUESTING CONSULTATION:  Dr. Sherril Croon.   HISTORY OF PRESENT ILLNESS:  Todd Nelson is a 70 year old Caucasian  gentleman who presents today for further evaluation of dysphagia.  He  states he had a neck surgery back in August 2008.  Postoperatively for  several weeks, he was having problems swallowing, but this improved.  He  noticed also at that he had problems swallowing very large pills such as  his fish oil.  However, over the last couple of months, he has had  recurrence of difficulty swallowing solid foods.  Now, he is having  difficulty swallowing liquids, but this is not as severe.  Symptoms are  progressively worsening.  Last week, he had an episode where he felt  like food got stuck and he actually vomited it back.  He denies any  odynophagia.  He does have heartburn, for which he was taking H2  blockers without control.  He was on Zantac, but recently started on  omeprazole.  This was about 10 days ago, and now he has good control of  his heartburn.  He denies any abdominal pain, constipation, diarrhea,  melena, or rectal bleeding.   CURRENT MEDICATIONS:  1. Norvasc 10 mg daily.  2. Diovan 80/12.5 mg daily.  3. Mobic 7.5 mg b.i.d.  4. Aspirin 81 mg daily.  5. Fish oil 1000 mg daily.  6. Omeprazole 20 mg daily.   ALLERGIES:  No known drug allergies.   PAST MEDICAL HISTORY:  Hypertension, hypercholesterolemia,  osteoarthritis, umbilical hernia, appendectomy, and tonsillectomy.  He  had neck surgery in August 2008.  He states he has a plate.  He had a  colonoscopy by Dr.  Dionicia Abler in April 2005.  He had 3 small polyps, one at  the cecum, two at the distal sigmoid colon, which were ablated and  turned out to be hyperplastic.  He had moderate-sized external  hemorrhoids.   FAMILY HISTORY:  Mother is 38, healthy.  Father died at age 23,  pancreatic cancer.  One brother died with MI.  No family history of  colorectal cancer.   SOCIAL HISTORY:  He is married and he has 3 children.  He is retired  from [employer].  He quit smoking in 1990.  He consumes 5-6 beers on  Saturdays and Sundays, but none during the week.   REVIEW OF SYSTEMS:  See HPI for GI.  CONSTITUTIONAL:  No weight loss.  CARDIOPULMONARY:  No chest pain or shortness of breath.  GENITOURINARY:  No dysuria or hematuria.   PHYSICAL EXAMINATION:  VITAL SIGNS:  Weight 205, height 5 feet 11  inches, temperature 98.3, blood pressure 140/82, and pulse is 72.  GENERAL:  Pleasant, well-nourished, well-developed Caucasian gentleman  in no acute distress.  SKIN:  Warm and dry.  No jaundice.  HEENT:  Sclerae nonicteric.  Oropharyngeal mucosa moist and pink.  No  lesions, erythema, or exudate.  No lymphadenopathy, thyromegaly.  CHEST:  Lungs are clear to auscultation.  CARDIAC:  Exam reveals regular rate and rhythm.  No murmurs, rubs, or  gallops.  ABDOMEN:  Positive bowel sounds, soft, nontender, and nondistended.  No  organomegaly or masses.  No rebound, tenderness, or guarding.  No  abdominal bruits.  He has an umbilical hernia, small, which is easily  reducible and nontender.  LOWER EXTREMITIES:  No edema.   IMPRESSION:  Todd Nelson is a 70 year old gentleman who presents with  progressive dysphagia predominately to solid foods.  As noted above, he  had symptoms initially after his neck surgery, but this improved and  recurred a couple of months ago.  He has problems with solid foods and  occasionally liquids.  He has had at  least 1 episode of what he  describes as an impaction, which resulted in  vomiting.  He may have  developed an esophageal stricture.  Gastroesophageal reflux disease,  better controlled on omeprazole.   PLAN:  1. EGD with Dr. Jena Gauss in the near future.  2. We will hold his aspirin for 4 days prior to procedure.  3. Continue omeprazole 20 mg daily.   I would like to thank Dr. Sherril Croon for allowing [place] to take part in the care  of this patient.       Tana Coast, P.AJonathon Bellows, M.D.  Electronically Signed    LL/MEDQ  D:  01/26/2008  T:  01/27/2008  Job:  [id number]

## 2011-02-09 NOTE — Op Note (Signed)
NAME:  Todd Nelson, Todd Nelson                ACCOUNT NO.:  [id number]   MEDICAL RECORD NO.:  [id number]          PATIENT TYPE:  AMB   LOCATION:  DAY                           FACILITY:  [facility]   PHYSICIAN:  R. Roetta Sessions, M.D. DATE OF BIRTH:  12-07-1940   DATE OF PROCEDURE:  DATE OF DISCHARGE:                               OPERATIVE REPORT   Esophagogastroduodenoscopy with Elease Hashimoto dilation followed by biopsy.   INDICATIONS FOR PROCEDURE:  A 70 year old gentleman with recent  progressive esophageal dysphagia to silence.  He has long standing  gastroesophageal reflux disease symptoms.  He takes omeprazole 20 mg  orally daily.  EGD now being done.  Risks, benefits, alternatives, and  limitations have been reviewed.  Previously and again today, all of his  questions were answered.  He is agreeable.  Please see the documentation  in the medical record.   PROCEDURE:  O2 saturation, blood pressure, and pulse of the patient  monitored throughout the entire procedure.   CONSCIOUS SEDATION:  Versed 75 mg IV divided doses.  Cetacaine spray for  topical pharyngeal anesthesia.   INSTRUMENT:  Pentax video chip system.   FINDINGS:  Examination of the tubular esophagus revealed noncritical-  appearing distal ring.  They were two tongues of salmon-colored mucosa  coming up 1.5-2 cm from the EG junction.  On opposite walls, there was  no esophagitis, noticed some neoplasia.  EG junction easily traversed  with a scope.  Stomach:  Gastric cavity was emptied and insufflated well  with air.  A thorough examination of the gastric mucosa including  retroflexed view of the proximal stomach, esophagogastric junction  demonstrated only a small hiatal hernia.  Pylorus was patent and easily  traversed.  Examination of the bulb, second portion revealed no  abnormalities.   THERAPEUTIC/DIAGNOSTIC MANEUVERS PERFORMED:  The scope was withdrawn.  A  56-French Maloney dilator was passed with full insertion with ease.   A  look back  revealed the ring had been ruptured nicely without apparent  complications.  Subsequently, the two tongues of salmon-colored mucosa  were biopsied for histologic study.  The patient tolerated the procedure  well and was reactive in Endoscopy.   IMPRESSION:  Schatzki ring status post dilation/disruption, as described  above, two tongues of salmon-colored mucosa seen, suspicious for short-  segment Barrett's esophagus, status post biopsy, small hiatal hernia,  otherwise normal stomach, D1 and D2.   RECOMMENDATIONS:  1. Increase omeprazole 20 mg orally twice daily before breakfast and      supper.  2. Followup on path.  3. Further recommendations to follow.      [name], M.D.  Electronically Signed     RMR/MEDQ  D:  02/06/2008  T:  02/07/2008  Job:  [id number]   cc:   Doreen Beam, MD  Fax: [phone]

## 2011-02-09 NOTE — Assessment & Plan Note (Signed)
[facility]                          [facility] OFFICE NOTE   PARRY, PO                       MRN:          [id number]  DATE:02/29/2008                            DOB:          [birth year]    PRIMARY CARDIOLOGIST:  Learta Codding, MD, [facility].   REASON FOR VISIT:  Six-month followup.   Todd Nelson returns to the clinic since last seen here in July 2008, by  Dr. Andee Lineman.  He has a history of nonobstructive CAD, by previous cardiac  catheterization in 2002.  He was cleared to proceed with cervical neck  surgery, by Dr. Andee Lineman, when last seen here in the clinic, following a  stress imaging study which showed no definite evidence of ischemia or  scar; EF 69%.   The patient underwent successful surgery, by Dr. Trey Sailors, shortly  thereafter.  Although it has relieved much of the discomfort, he still  has some associated weakness.   The patient denies any interim development of exertional angina  pectoris.  He does not smoke.  He closely monitors his blood pressures  at home, reporting readings in the 120-140 systolic range.   Electrocardiogram today indicates an NSR at 73 bpm with normal axis; no  ischemic changes; isolated PVCs.   MEDICATIONS:  1. Norvasc 10 daily.  2. Diovan/hydrochlorothiazide 80/12.5 daily.  3. Fish oil 1000 daily.  4. Simvastatin 40 daily.  5. Mobic 7.5 daily.  6. Aspirin 81 b.i.d.  7. Omeprazole 20 b.i.d.   Most Recent Lipid Profile:  Total cholesterol 126, triglyceride 55, HDL  40, and LDL 75, this past April.   PHYSICAL EXAMINATION:  VITAL SIGNS:  Blood pressure 141/86; pulse 76,  regular; and weight 203.6.  GENERAL:  A 70 year old male, sitting upright, and in no distress.  HEENT:  Normocephalic.  Atraumatic.  NECK:  Palpable carotid pulse without bruits; no JVD.  LUNGS:  Clear to auscultation bilaterally.  HEART:  Regular rate and rhythm (S1 and S2), occasional premature beat.  No significant murmurs.  No  rubs.  ABDOMEN:  Soft.  Nontender.  EXTREMITIES:  No edema.  NEURO:  Flat affect.  There are no focal deficits.   IMPRESSION:  1. Nonobstructive coronary artery disease.      a.     40% ostial left main; 50% proximal circumflex by cardiac       catheterization, November 2002.      b.     Negative adequate exercise stress Cardiolite; ejection       fraction 69%, July 2008.  2. Dyslipidemia.  3. Hypertension, stable.   PLAN:  1. Down-titrate aspirin to 81 mg daily, as maintenance dose.  2. Up-titrate fish oil to 1 capsule twice daily.  3. Follow up fasting lipid/liver profile in 6 months.  4. Schedule return clinic followup with Dr. Andee Lineman in 1 year, or      sooner if needed.      Gene Serpe, PA-C  Electronically Signed      Learta Codding, MD,FACC  Electronically Signed   GS/MedQ  DD: 02/29/2008  DT: 03/01/2008  Job #:  [id number] 

## 2011-02-09 NOTE — Assessment & Plan Note (Signed)
[facility]                          [facility] OFF[facility] NOTE   GAGANDEEP, PETTET                       MRN:          [id number]  DATE:04/12/2007                            DOB:          09-16-1941    REFERRING PHYSICIAN:  Dhruv Vyas   REASON FOR CONSULTATION:  Preoperative evaluation of a patient with  known mild left main coronary artery prior to back surgery.   HISTORY OF PRESENT ILLNESS:  The patient is a 70 year old male with a  history of mild-to-moderate coronary artery disease with ostial left  main disease in the proximal left circumflex, status post cardiac  catheterization performed by Dr. Gerri Spore in 2002.  The patient was  found to have a 30-40% left main stenosis and a 50% stenosis in the  proximal vessel of the circumflex coronary artery.  He has been  asymptomatic; however, denies any chest pain, shortness of breath,  orthopnea, or PND.  The patient is now currently in need of  decompressive surgery to C5-6.  He has severe back pain as well as right  arm numbness and weakness.  The patient has limited his exercise  tolerance due to his chronic back problems; however, he does not report  any chest pain, shortness of breath, orthopnea, PND, palpitations, or  syncope.  From a cardiovascular standpoint, he is essentially  asymptomatic.   Of note is that the patient has an LDL of 138 and is not on a statin  drug therapy.  He does take aspirin on a regular basis.  He is able to  walk a flight of stairs without too much difficulty; therefore, seems to  be able to tolerate greater than 5 mets of exercise tolerance.   ALLERGIES:  No known drug allergies.   MEDICATIONS:  1. Norvasc 10 mg p.o. daily.  2. Diovan/hydrochlorothiazide 80/12.5 mg p.o. daily.  3. Mobic 7.5 daily.  4. Aspirin 81 mg daily.  5. Fish oil 1000 mg p.o. daily.   FAMILY HISTORY:  Mother is still alive and is fairly good health.  Father died of cancer.  One  brother died of a heart attack.  Another  brother has hypertension.   SOCIAL HISTORY:  The patient used to smoke but quit 20 years ago.  The  patient lives in [place].   REVIEW OF SYSTEMS:  As per HPI.  No nausea or vomiting.  No fevers or  chills.  No melena or hematochezia.  No dysuria or frequency.  Chronic  back and neck pain.   PHYSICAL EXAMINATION:  VITAL SIGNS:  Blood pressure is 152/95, heart  rate 80 beats per minute.  Weight is 208 pounds.  NECK:  Normal carotid upstrokes.  No carotid bruits.  HEENT:  Normal.  LUNGS:  Diminished breath sounds bilaterally but otherwise without  wheezing.  HEART:  Regular rate and rhythm with normal S1 and S2.  No murmurs,  gallops or rubs.  ABDOMEN:  Soft, nontender.  No rebound or guarding.  Good bowel sounds.  EXTREMITIES:  No clubbing, cyanosis or edema.  NEURO:  Patient is alert, oriented, grossly nonfocal.  PROBLEM LIST:  1. Preoperative cardiac evaluation.  2. Mild-to-moderate nonobstructive coronary artery disease by      catheterization in 2002.  3. Fair exercise tolerance greater than 5 mets.  4. Cervical disk disease.   PLAN:  1. The patient appears to be at fairly low risk for cardiac      complication related to his surgery.  He has no unstable symptoms      of substernal chest pain.  His exercise tolerance is fair.  He is      able to achieve 5 mets.  2. Given the fact that already patient has left main coronary artery      disease, albeit not hemodynamically significant, will proceed with      an exercise Cardiolite study.  I had to make sure the patient has      no ischemia.  3. If the Cardiolite study is within normal limits, (it will be      performed in out [place] office, as we had no openings in [place]),      and the patient can proceed at low risk with back surgery.  4. I started the patient on simvastatin therapy at 20 mg p.o. nightly.      Lipids and LFTs will be checked in six weeks.     Learta Codding,  MD,FACC  Electronically Signed    GED/MedQ  DD: 04/12/2007  DT: 04/12/2007  Job #: [id number]   cc:   Wayne Sever, M.D.

## 2011-02-12 NOTE — Op Note (Signed)
NAME:  Todd Nelson, Todd Nelson                          ACCOUNT NO.:  [id number]   MEDICAL RECORD NO.:  [id number]                   PATIENT TYPE:  AMB   LOCATION:  DAY                                  FACILITY:  [facility]   PHYSICIAN:  Lionel December, M.D.                 DATE OF BIRTH:  05/12/1941   DATE OF PROCEDURE:  01/24/2004  DATE OF DISCHARGE:                                 OPERATIVE REPORT   PROCEDURE:  Total colonoscopy.   INDICATIONS FOR PROCEDURE:  Todd Nelson is  70 year old Caucasian male who is  here for screening colonoscopy.  Family history is negative for colorectal  carcinoma.  The procedure and risks were reviewed with the patient, and informed consent  was obtained.   PREOPERATIVE MEDICATIONS:  Demerol 50 mg IV, Versed 4 mg IV.   FINDINGS:  The procedure was performed in the endoscopy suite.  The  patient's vital signs and O2 saturations were monitored during the procedure  and remained stable.  The patient was placed in the left lateral recumbent  position and rectal examination performed.  No abnormality noted on external  or digital exam.  The Olympus videoscope was placed into the rectum and  advanced into the region of the sigmoid colon and beyond.  The preparation  was excellent.  The scope was passed to the cecum which was identified by  the appendiceal orifice and ileocecal valve.  There was a small polyp at the  cecum which was ablated by cold biopsy.  As the scope was withdrawn, the  colonic mucosa was once again carefully examined.  There were two more small  polyps at the distal sigmoid colon which were ablated by cold biopsy, and  all of these were submitted in one container.  They were suspicious for  hyperplastic polyps.  The rectal mucosa was normal.  The scope was  retroflexed to examine the anorectal junction, and moderate-sized  hemorrhoids were noted below the dentate line.  The endoscope was  straightened and withdrawn.  The patient tolerated the procedure  well.   FINAL DIAGNOSES:  1. Three small polyps that were ablated by cold biopsy, one at the cecum and     two at the distal sigmoid colon.  2. Moderate-sized external hemorrhoids.   RECOMMENDATIONS:  1. Standard instructions given.  2. I will be contacting the patient with the biopsy results and further     recommendations.      ___________________________________________                                            Lionel December, M.D.   NR/MEDQ  D:  01/24/2004  T:  01/24/2004  Job:  [id number]   cc:   Doreen Beam  [address]  [place]  [place] [address]  Fax: [phone]

## 2011-02-12 NOTE — Cardiovascular Report (Signed)
[facility]  Patient:    Todd Nelson, Todd Nelson Visit Number: [id number] MRN: [id number]          Service Type: CAT Location: [facility] 2859 [id number] Attending Physician:  Daisey Must Dictated by:   Daisey Must, M.D. [facility] Proc. Date: 08/10/01 Admit Date:  08/10/2001   CC:         Doreen Beam, M.D.  [facility]  Cardiac Catheterization Laboratory   Cardiac Catheterization  PROCEDURES PERFORMED: Left heart catheterization with coronary angiography and left ventriculography.  INDICATIONS: The patient is a 70 year old male, who has been having progressive exertional dyspnea and chest tightness. An echocardiogram showed asymmetric septal hypertrophy. He was referred for cardiac catheterization to rule out obstructive coronary artery disease.  DESCRIPTION OF PROCEDURE: A 6 French sheath was placed in the right femoral artery. Left coronary arteriography was performed with a 6 [place] JL4 catheter. Right coronary arteriography was performed with a 6 [place] JR4 catheter. Left ventriculography and abdominal aortography were performed with a  6 French angled pigtail catheter. Contrast was Omnipaque. There were no complications.  RESULTS:  HEMODYNAMICS: Left ventricular pressure 130/16. Aortic pressure 120/78.  There was minimal to no outflow tract gradient on catheter pullback across the aortic valve.  LEFT VENTRICULOGRAM: The left ventricle is hyperdynamic with no focal wall motion abnormalities. Ejection fraction is estimated at greater than 65%. There is no mitral regurgitation.  ABDOMINAL AORTOGRAM: Abdominal aortogram reveals minor irregularities and the abdominal aorta and iliac arteries were patent, renal arteries bilaterally.  CORONARY ARTERIOGRAPHY: (Right dominant).  Left main: Left main has an ostial 30-40% stenosis.  Left anterior descending: The left anterior descending artery has a 30% stenosis in the proximal vessel, 20% in the mid  vessel. There is a large first diagonal branch and a smaller second diagonal branch arising from the LAD.  Left circumflex: The left circumflex has a 50% stenosis in the proximal vessel and a very sharp bend. The circumflex gives rise to a small to normal sized ramus intermediate, small OM-1, small to normal sized OM-2 and small to normal sized OM-3.  Right coronary artery: The right coronary artery has a 20% stenosis in the proximal vessel, 20% in the mid vessel. The distal right coronary artery gives rise to a large posterior descending artery which has a 20% stenosis at its origin. There is a normal sized posterolateral branch arising from the distal right coronary artery.  IMPRESSIONS: 1. Normal to hyperdynamic left ventricular systolic function with minimal to    no left ventricular outflow tract gradient. 2. Mild to moderate coronary artery disease as described involving the    ostial left main and the proximal left circumflex. However, neither of    these lesions appear to be hemodynamically significant.  RECOMMENDATIONS: Medical therapy. Dictated by:   Daisey Must, M.D. [facility] Attending Physician:  Daisey Must DD:  08/10/01 TD:  08/10/01 Job: [id number]

## 2011-07-28 ENCOUNTER — Telehealth: Payer: Self-pay | Admitting: Internal Medicine

## 2011-07-28 NOTE — Telephone Encounter (Signed)
LMOM for Pat at [facility] that I have not received a referral on pt. If she will send me referral I will get it taken care of right away.

## 2011-07-28 NOTE — Telephone Encounter (Signed)
Pat from [facility] was checking on the status of a triage referral. I told her I didn't see anything set up for pt as of yet and I would see if you had received anything.

## 2011-07-29 ENCOUNTER — Telehealth: Payer: Self-pay

## 2011-07-29 NOTE — Telephone Encounter (Signed)
LMOM that pt has appt 08/02/2011 for OV at 9:30 AM with Gerrit Halls, NP.

## 2011-07-30 ENCOUNTER — Encounter: Payer: Self-pay | Admitting: Internal Medicine

## 2011-08-02 ENCOUNTER — Encounter: Payer: Self-pay | Admitting: Gastroenterology

## 2011-08-02 ENCOUNTER — Ambulatory Visit (INDEPENDENT_AMBULATORY_CARE_PROVIDER_SITE_OTHER): Payer: Medicare Other | Admitting: Gastroenterology

## 2011-08-02 VITALS — BP 143/80 | HR 71 | Temp 98.6°F | Ht 70.0 in | Wt 189.0 lb

## 2011-08-02 DIAGNOSIS — K2270 Barrett's esophagus without dysplasia: Secondary | ICD-10-CM

## 2011-08-02 NOTE — Progress Notes (Signed)
Cc to PCP 

## 2011-08-02 NOTE — Progress Notes (Signed)
Todd Nelson presented today at the request of Dr. Sherril Croon for a possible screening colonoscopy. His last one was in 2005 with moderate external hemorrhoids and hyperplastic polyps. Not due again till 2015. No FH of colon cancer. He has no changes in bowel habits, abdominal pain, or evidence of brbpr. He also has a hx of short segment Barrett's, diagnosed on EGD in 2009. F/U EGD in 2010 completed for one year surveillance. He is not due for another until 2013. He denies any reflux, abdominal pain, N/V, dysphagia.  At this time, he was given an appt inadvertently and wasn't to be seen until at least 2013. There were no issues at all with the patient today, and he was not charged for the visit. I apologized for the confusion of having him come in, but he was agreeable.   We will cc Dr. Sherril Croon on this as well, and we appreciate the referral for possible colonoscopy. He will not need another colonoscopy until 2015 or if something changes in the interim. As of note, labs included from Oct 2012 by Dr. Sherril Croon were normal LFTs, no anemia on CBC.

## 2011-09-10 ENCOUNTER — Encounter: Payer: Self-pay | Admitting: Cardiology

## 2011-09-10 ENCOUNTER — Ambulatory Visit (INDEPENDENT_AMBULATORY_CARE_PROVIDER_SITE_OTHER): Payer: Medicare Other | Admitting: Cardiology

## 2011-09-10 VITALS — BP 134/82 | HR 71 | Ht 71.0 in | Wt 189.0 lb

## 2011-09-10 DIAGNOSIS — I739 Peripheral vascular disease, unspecified: Secondary | ICD-10-CM

## 2011-09-10 DIAGNOSIS — I10 Essential (primary) hypertension: Secondary | ICD-10-CM

## 2011-09-10 DIAGNOSIS — I2510 Atherosclerotic heart disease of native coronary artery without angina pectoris: Secondary | ICD-10-CM

## 2011-09-10 NOTE — Assessment & Plan Note (Signed)
Blood pressure well controlled. I told the patient to make sure that he is compliant with his medications. Lipid panel and hypertension is followed by his primary care physician.

## 2011-09-10 NOTE — Assessment & Plan Note (Signed)
The patient over the last several years had  several negative stress test. He has remained asymptomatic. We discussed risk factor modification therapeutic lifestyle changes. Given his 40% left main lesion however, we discussed the possibility in 2 years of performing a 256/CT scanner to evaluate his left main coronary artery lesion. I think he should be followed closely. The patient also was instructed that he denies any symptoms of chest pain immediately to present to our office or to the hospital. We discussed the risks and benefits of a cardiac CT scan.

## 2011-09-10 NOTE — Assessment & Plan Note (Signed)
No symptoms of claudication.no further assessment needed at the present time

## 2011-09-10 NOTE — Progress Notes (Signed)
Todd Bottoms, MD, [facility] Board Certified in Adult Cardiovascular Medicine,Internal Medicine and Critical Care Medicine    CC: Routine followup patient with coronary artery disease  HPI:  The patient is a 70 year old male, former smoker with a history of single-vessel coronary artery disease with a 40% left main lesion. He has no other significant coronary artery disease. The patient had a history of knee surgery a year ago at which time he underwent preoperative stress testing. There were no perfusion defects and his ejection fraction was 69%. The patient has been doing well. He reports no chest pain shortness of breath orthopnea PND. The patient remained extremely physically active. He reports no limitations in his physical activity. Lipid panel and blood pressure medications are followed and controlled by his primary care physician.     PMH: reviewed and listed in Problem List in Electronic Records (and see below) Past Medical History  Diagnosis Date  . Arthritis   . Barrett esophagus 5/09    first dx on EGD   . Schatzki's ring 5/09    s/p dailation  . Umbilical hernia   . CAD (coronary artery disease) 40% left main coronary artery disease but negative stress test for ischemia in 2011 with an ejection fraction of 69%    . Hyperchloremia   . HTN (hypertension)   . GERD (gastroesophageal reflux disease)   . Alcohol use   . Personal history of tobacco use, presenting hazards to health   . Hemorrhoids, external   . Osteoarthritis      Allergies/SH/FHX : available in Electronic Records for review  Medications: Current Outpatient Prescriptions  Medication Sig Dispense Refill  . amLODipine (NORVASC) 10 MG tablet Take 10 mg by mouth daily.        Marland Kitchen aspirin 81 MG tablet Take 81 mg by mouth daily.        . fexofenadine (ALLEGRA) 180 MG tablet Take 180 mg by mouth daily.        . fish oil-omega-3 fatty acids 1000 MG capsule Take 1 g by mouth 2 (two) times daily.       .  meloxicam (MOBIC) 7.5 MG tablet Take 7.5 mg by mouth 2 (two) times daily.       Marland Kitchen omeprazole (PRILOSEC) 20 MG capsule Take 20 mg by mouth daily.        . simvastatin (ZOCOR) 20 MG tablet Take 20 mg by mouth at bedtime.        . valsartan-hydrochlorothiazide (DIOVAN-HCT) 80-12.5 MG per tablet Take 1 tablet by mouth daily.          ROS: No nausea or vomiting.  no fever or chills.No melena or hematochezia.No bleeding.No claudication .  Physical Exam: BP 134/82  Pulse 71  Ht 5\' 11"  (1.803 m)  Wt 189 lb (85.73 kg)  BMI 26.36 kg/m2 General:well-nourished white male. In no distress Neck: Normal carotid upstroke and no carotid bruits. No thyromegaly nonnodular thyroid Lungs: Clear breath sounds bilaterally. No wheezing Cardiac: Regular rate and rhythm. Normal S1-S2 no pathological murmurs Vascular: No edema. Normal posterior tibial pulses bilaterally Skin: Warm and dry  12lead ECG: Normal sinus rhythm with a single PVC Limited bedside ECHO:N/A  Counseling was provided regarding the current medical condition and included: . Diagnosis, impressions, prognosis, recommended diagnostic studies  . Risks and benefits of treatment options  . Instructions for management, treatment and/or follow-up care  . Importance of compliance with treatment, risk factor reduction     Time spent counseling was  45 minutes and recorded in the Problem List.   Assessment and Plan

## 2011-09-10 NOTE — Patient Instructions (Signed)
Continue all current medications. Your physician wants you to follow up in:  1 year.  You will receive a reminder letter in the mail one-two months in advance.  If you don't receive a letter, please call our office to schedule the follow up appointment   

## 2012-03-06 ENCOUNTER — Encounter: Payer: Self-pay | Admitting: Internal Medicine

## 2012-07-27 ENCOUNTER — Ambulatory Visit (INDEPENDENT_AMBULATORY_CARE_PROVIDER_SITE_OTHER): Payer: Medicare Other | Admitting: Gastroenterology

## 2012-07-27 ENCOUNTER — Encounter: Payer: Self-pay | Admitting: Gastroenterology

## 2012-07-27 VITALS — BP 135/73 | HR 70 | Temp 97.6°F | Ht 70.0 in | Wt 184.6 lb

## 2012-07-27 DIAGNOSIS — K2270 Barrett's esophagus without dysplasia: Secondary | ICD-10-CM

## 2012-07-27 DIAGNOSIS — Z8719 Personal history of other diseases of the digestive system: Secondary | ICD-10-CM

## 2012-07-27 NOTE — Assessment & Plan Note (Addendum)
71 year old male with hx of short-segment Barrett's and last EGD in 2010 by Dr. Jena Gauss. Needs surveillance EGD at this time. Hx of Schatzki's ring, with last dilation in 2009. Notes pill and solid food dysphagia intermittently.   Proceed with upper endoscopy and dilation in the near future with Dr. Jena Gauss. The risks, benefits, and alternatives have been discussed in detail with patient. They have stated understanding and desire to proceed.  Phenergan 12.5 mg IV on call due to hx of daily ETOH Continue Prilosec daily

## 2012-07-27 NOTE — Progress Notes (Signed)
Faxed to PCP

## 2012-07-27 NOTE — Progress Notes (Signed)
 Referring Provider: Vyas, Dhruv B., MD Primary Care Physician:  VYAS,DHRUV B., MD  Chief Complaint  Patient presents with  . Follow-up    HPI:   71-year-old male with hx of short-segment Barrett's, Schatzki's ring s/p dilation, presenting today for visit prior to surveillance EGD. Last EGD in 2010 with short segment Barrett's esophagus. Last TCS in 2005, hyperplastic polyps. Due for routine screening in 2015.  Notes pill dysphagia, specifically with fish oil capsule. Notes difficulty with hamburger and ground beef if too dry. No abdominal pain. No reflux, heartburn. No wt loss or lack of appetite. No rectal bleeding. No change in bowel habits. Mobic twice a day. Prilosec once daily.   Past Medical History  Diagnosis Date  . Arthritis   . Barrett esophagus 5/09    first dx on EGD   . Schatzki's ring 5/09    s/p dilation  . Umbilical hernia   . CAD (coronary artery disease)   . Hyperchloremia   . HTN (hypertension)   . GERD (gastroesophageal reflux disease)   . Alcohol use   . Personal history of tobacco use, presenting hazards to health   . Hemorrhoids, external   . Osteoarthritis     Past Surgical History  Procedure Date  . Colonoscopy 2005    hyperplastic polyps,hemorrhoids  . Appendectomy   . Tonsillectomy   . Neck surgery 2008    plate  . Esophagogastroduodenoscopy 02/06/08    RMR: short-segment Barrett's, Schatzki's ring s/p dilation   . Knee surgery 2011    both knees  . Esophagogastroduodenoscopy July 2010    RMR: short-segment Barrett's    Current Outpatient Prescriptions  Medication Sig Dispense Refill  . amLODipine (NORVASC) 10 MG tablet Take 10 mg by mouth daily.        . fexofenadine (ALLEGRA) 180 MG tablet Take 180 mg by mouth daily.        . fish oil-omega-3 fatty acids 1000 MG capsule Take 1 g by mouth 2 (two) times daily.       . meloxicam (MOBIC) 7.5 MG tablet Take 7.5 mg by mouth 2 (two) times daily.       . omeprazole (PRILOSEC) 20 MG capsule  Take 20 mg by mouth daily.        . simvastatin (ZOCOR) 20 MG tablet Take 20 mg by mouth at bedtime.        . valsartan-hydrochlorothiazide (DIOVAN-HCT) 80-12.5 MG per tablet Take 1 tablet by mouth daily.        . aspirin 81 MG tablet Take 81 mg by mouth daily.          Allergies as of 07/27/2012  . (No Known Allergies)    Family History  Problem Relation Age of Onset  . Pancreatic cancer Father 78    History   Social History  . Marital Status: Married    Spouse Name: N/A    Number of Children: N/A  . Years of Education: N/A   Occupational History  . retired     from [employer]  . part-time     [employer]    Social History Main Topics  . Smoking status: Former Smoker -- 1.0 packs/day for 20 years    Types: Cigarettes    Quit date: 09/27/1984  . Smokeless tobacco: Never Used  . Alcohol Use: Yes     3 beers a day  . Drug Use: No  . Sexually Active: None   Other Topics Concern  . None   Social   History Narrative  . None    Review of Systems: Gen: SEE HPI CV: Denies chest pain, palpitations, syncope, peripheral edema, and claudication. Resp: Denies dyspnea at rest, cough, wheezing, coughing up blood, and pleurisy. GI: SEE HPI Derm: Denies rash, itching, dry skin Psych: Denies depression, anxiety, memory loss, confusion. No homicidal or suicidal ideation.  Heme: Denies bruising, bleeding, and enlarged lymph nodes.  Physical Exam: BP 135/73  Pulse 70  Temp 97.6 F (36.4 C) (Temporal)  Ht 5' 10" (1.778 m)  Wt 184 lb 9.6 oz (83.734 kg)  BMI 26.49 kg/m2 General:   Alert and oriented. No distress noted. Pleasant and cooperative.  Head:  Normocephalic and atraumatic. Eyes:  Conjuctiva clear without scleral icterus. Mouth:  Oral mucosa pink and moist. Good dentition. No lesions. Neck:  Supple, without mass or thyromegaly. Heart:  S1, S2 present without murmurs, rubs, or gallops. Regular rate and rhythm. Abdomen:  +BS, soft, non-tender and non-distended. No rebound or  guarding. No HSM or masses noted. Msk:  Symmetrical without gross deformities. Normal posture. Extremities:  Without edema. Neurologic:  Alert and  oriented x4;  grossly normal neurologically. Skin:  Intact without significant lesions or rashes. Cervical Nodes:  No significant cervical adenopathy. Psych:  Alert and cooperative. Normal mood and affect.  

## 2012-07-27 NOTE — Patient Instructions (Addendum)
We have set you up for an upper endoscopy with Dr. Jena Gauss.   Continue to take Prilosec daily.  Further recommendations to follow.

## 2012-08-08 ENCOUNTER — Encounter (HOSPITAL_COMMUNITY): Payer: Self-pay | Admitting: Pharmacy Technician

## 2012-08-21 ENCOUNTER — Encounter (HOSPITAL_COMMUNITY): Payer: Self-pay | Admitting: *Deleted

## 2012-08-21 ENCOUNTER — Ambulatory Visit (HOSPITAL_COMMUNITY)
Admission: RE | Admit: 2012-08-21 | Discharge: 2012-08-21 | Disposition: A | Discharge status: Home / Self Care | Payer: Medicare Other | Source: Ambulatory Visit | Attending: Internal Medicine | Admitting: Internal Medicine

## 2012-08-21 ENCOUNTER — Encounter (HOSPITAL_COMMUNITY)
Admission: RE | Disposition: A | Discharge status: Home / Self Care | Payer: Self-pay | Source: Ambulatory Visit | Attending: Internal Medicine

## 2012-08-21 DIAGNOSIS — K2270 Barrett's esophagus without dysplasia: Secondary | ICD-10-CM

## 2012-08-21 DIAGNOSIS — K449 Diaphragmatic hernia without obstruction or gangrene: Secondary | ICD-10-CM | POA: Insufficient documentation

## 2012-08-21 DIAGNOSIS — K222 Esophageal obstruction: Secondary | ICD-10-CM | POA: Insufficient documentation

## 2012-08-21 DIAGNOSIS — R1310 Dysphagia, unspecified: Secondary | ICD-10-CM

## 2012-08-21 DIAGNOSIS — I10 Essential (primary) hypertension: Secondary | ICD-10-CM | POA: Insufficient documentation

## 2012-08-21 DIAGNOSIS — K2940 Chronic atrophic gastritis without bleeding: Secondary | ICD-10-CM | POA: Insufficient documentation

## 2012-08-21 DIAGNOSIS — A048 Other specified bacterial intestinal infections: Secondary | ICD-10-CM | POA: Insufficient documentation

## 2012-08-21 HISTORY — PX: ESOPHAGOGASTRODUODENOSCOPY (EGD) WITH ESOPHAGEAL DILATION: SHX5812

## 2012-08-21 SURGERY — ESOPHAGOGASTRODUODENOSCOPY (EGD) WITH ESOPHAGEAL DILATION
Anesthesia: Moderate Sedation

## 2012-08-21 MED ORDER — STERILE WATER FOR IRRIGATION IR SOLN
Status: DC | PRN
  Administered 2012-08-21: 09:00:00

## 2012-08-21 MED ORDER — MEPERIDINE HCL 100 MG/ML IJ SOLN
INTRAMUSCULAR | Status: DC | PRN
  Administered 2012-08-21: 25 mg via INTRAVENOUS
  Administered 2012-08-21: 50 mg via INTRAVENOUS

## 2012-08-21 MED ORDER — MIDAZOLAM HCL 5 MG/5ML IJ SOLN
INTRAMUSCULAR | Status: AC
  Filled 2012-08-21: qty 10

## 2012-08-21 MED ORDER — MIDAZOLAM HCL 5 MG/5ML IJ SOLN
INTRAMUSCULAR | Status: DC | PRN
  Administered 2012-08-21 (×2): 2 mg via INTRAVENOUS

## 2012-08-21 MED ORDER — MEPERIDINE HCL 100 MG/ML IJ SOLN
INTRAMUSCULAR | Status: AC
  Filled 2012-08-21: qty 2

## 2012-08-21 MED ORDER — SODIUM CHLORIDE 0.9 % IJ SOLN
INTRAMUSCULAR | Status: AC
  Filled 2012-08-21: qty 10

## 2012-08-21 MED ORDER — PROMETHAZINE HCL 25 MG/ML IJ SOLN
12.5000 mg | Freq: Once | INTRAMUSCULAR | Status: AC
  Administered 2012-08-21: 12.5 mg via INTRAVENOUS

## 2012-08-21 MED ORDER — SODIUM CHLORIDE 0.45 % IV SOLN
INTRAVENOUS | Status: DC
  Administered 2012-08-21: 08:00:00 via INTRAVENOUS

## 2012-08-21 MED ORDER — PROMETHAZINE HCL 25 MG/ML IJ SOLN
INTRAMUSCULAR | Status: AC
  Filled 2012-08-21: qty 1

## 2012-08-21 NOTE — Interval H&P Note (Signed)
History and Physical Interval Note:  08/21/2012 8:51 AM  Todd Nelson  has presented today for surgery, with the diagnosis of Barretts Esophagus  The various methods of treatment have been discussed with the patient and family. After consideration of risks, benefits and other options for treatment, the patient has consented to  Procedure(s) (LRB) with comments: ESOPHAGOGASTRODUODENOSCOPY (EGD) WITH ESOPHAGEAL DILATION (N/A) - 8:45/GIVE PHENERGAN 12.5MG  IV 30 MINS PRIOR TO PROCEDURE as a surgical intervention .  The patient's history has been reviewed, patient examined, no change in status, stable for surgery.  I have reviewed the patient's chart and labs.  Questions were answered to the patient's satisfaction.     Eula Listen  As above. EGD with dilation/biopsy is appropriate.The risks, benefits, limitations, alternatives and imponderables have been reviewed with the patient. Potential for esophageal dilation, biopsy, etc. have also been reviewed.  Questions have been answered. All parties agreeable.

## 2012-08-21 NOTE — H&P (View-Only) (Signed)
Referring Provider: Ignatius Specking., MD Primary Care Physician:  Ignatius Specking., MD  Chief Complaint  Patient presents with  . Follow-up    HPI:   71 year old male with hx of short-segment Barrett's, Schatzki's ring s/p dilation, presenting today for visit prior to surveillance EGD. Last EGD in 2010 with short segment Barrett's esophagus. Last TCS in 2005, hyperplastic polyps. Due for routine screening in 2015.  Notes pill dysphagia, specifically with fish oil capsule. Notes difficulty with hamburger and ground beef if too dry. No abdominal pain. No reflux, heartburn. No wt loss or lack of appetite. No rectal bleeding. No change in bowel habits. Mobic twice a day. Prilosec once daily.   Past Medical History  Diagnosis Date  . Arthritis   . Barrett esophagus 5/09    first dx on EGD   . Schatzki's ring 5/09    s/p dilation  . Umbilical hernia   . CAD (coronary artery disease)   . Hyperchloremia   . HTN (hypertension)   . GERD (gastroesophageal reflux disease)   . Alcohol use   . Personal history of tobacco use, presenting hazards to health   . Hemorrhoids, external   . Osteoarthritis     Past Surgical History  Procedure Date  . Colonoscopy 2005    hyperplastic polyps,hemorrhoids  . Appendectomy   . Tonsillectomy   . Neck surgery 2008    plate  . Esophagogastroduodenoscopy 02/06/08    RMR: short-segment Barrett's, Schatzki's ring s/p dilation   . Knee surgery 2011    both knees  . Esophagogastroduodenoscopy July 2010    RMR: short-segment Barrett's    Current Outpatient Prescriptions  Medication Sig Dispense Refill  . amLODipine (NORVASC) 10 MG tablet Take 10 mg by mouth daily.        . fexofenadine (ALLEGRA) 180 MG tablet Take 180 mg by mouth daily.        . fish oil-omega-3 fatty acids 1000 MG capsule Take 1 g by mouth 2 (two) times daily.       . meloxicam (MOBIC) 7.5 MG tablet Take 7.5 mg by mouth 2 (two) times daily.       Marland Kitchen omeprazole (PRILOSEC) 20 MG capsule  Take 20 mg by mouth daily.        . simvastatin (ZOCOR) 20 MG tablet Take 20 mg by mouth at bedtime.        . valsartan-hydrochlorothiazide (DIOVAN-HCT) 80-12.5 MG per tablet Take 1 tablet by mouth daily.        Marland Kitchen aspirin 81 MG tablet Take 81 mg by mouth daily.          Allergies as of 07/27/2012  . (No Known Allergies)    Family History  Problem Relation Age of Onset  . Pancreatic cancer Father 36    History   Social History  . Marital Status: Married    Spouse Name: N/A    Number of Children: N/A  . Years of Education: N/A   Occupational History  . retired     from [place]  . part-time     [employer]    Social History Main Topics  . Smoking status: Former Smoker -- 1.0 packs/day for 20 years    Types: Cigarettes    Quit date: 09/27/1984  . Smokeless tobacco: Never Used  . Alcohol Use: Yes     3 beers a day  . Drug Use: No  . Sexually Active: None   Other Topics Concern  . None   Social  History Narrative  . None    Review of Systems: Gen: SEE HPI CV: Denies chest pain, palpitations, syncope, peripheral edema, and claudication. Resp: Denies dyspnea at rest, cough, wheezing, coughing up blood, and pleurisy. GI: SEE HPI Derm: Denies rash, itching, dry skin Psych: Denies depression, anxiety, memory loss, confusion. No homicidal or suicidal ideation.  Heme: Denies bruising, bleeding, and enlarged lymph nodes.  Physical Exam: BP 135/73  Pulse 70  Temp 97.6 F (36.4 C) (Temporal)  Ht 5\' 10"  (1.778 m)  Wt 184 lb 9.6 oz (83.734 kg)  BMI 26.49 kg/m2 General:   Alert and oriented. No distress noted. Pleasant and cooperative.  Head:  Normocephalic and atraumatic. Eyes:  Conjuctiva clear without scleral icterus. Mouth:  Oral mucosa pink and moist. Good dentition. No lesions. Neck:  Supple, without mass or thyromegaly. Heart:  S1, S2 present without murmurs, rubs, or gallops. Regular rate and rhythm. Abdomen:  +BS, soft, non-tender and non-distended. No rebound or  guarding. No HSM or masses noted. Msk:  Symmetrical without gross deformities. Normal posture. Extremities:  Without edema. Neurologic:  Alert and  oriented x4;  grossly normal neurologically. Skin:  Intact without significant lesions or rashes. Cervical Nodes:  No significant cervical adenopathy. Psych:  Alert and cooperative. Normal mood and affect.

## 2012-08-21 NOTE — Op Note (Signed)
[facility] [address] [place], [address]   ENDOSCOPY PROCEDURE REPORT  PATIENT: [name], Todd Nelson  MR#: [id number] BIRTHDATE: Feb 16, 1941 , 71  yrs. old GENDER: Male ENDOSCOPIST: R.  Roetta Sessions, MD FACP FACG REFERRED BY:  Doreen Beam, M.D. PROCEDURE DATE:  08/21/2012 PROCEDURE:     EGD with Elease Hashimoto dilation followed by esophageal biopsy  INDICATIONS:    history short segment Barrett's; history of Schatzki's ring; recurrent esophageal dysphagia to solids and pills  INFORMED CONSENT:   The risks, benefits, limitations, alternatives and imponderables have been discussed.  The potential for biopsy, esophogeal dilation, etc. have also been reviewed.  Questions have been answered.  All parties agreeable.  Please see the history and physical in the medical record for more information.  MEDICATIONS:   Versed 4 mg IV Inderal 75 mg IV in divided dose. Cetacaine spray.  DESCRIPTION OF PROCEDURE:   The EG-2990i ([id number])  endoscope was introduced through the mouth and advanced to the second portion of the duodenum without difficulty or limitations.  The mucosal surfaces were surveyed very carefully during advancement of the scope and upon withdrawal.  Retroflexion view of the proximal stomach and esophagogastric junction was performed.      FINDINGS: esophagus was patent. Noncritical Schatzki's ring. 2 skinny "tongues" of salmon-colored epithelium coming up from the GE junction no more than 5-6 mm. Salmon-colored epithelium appeared unimpressive. Stomach empty. Small hiatal hernia. Couple of antral erosions. No ulcer or infiltrating process. Patent pylorus. Normal first and second portion of the duodenum.  THERAPEUTIC / DIAGNOSTIC MANEUVERS PERFORMED:  A 56 French Maloney dilator was passed to full insertion easily. A look back revealed no apparent complication related to this maneuver.   COMPLICATIONS:  None  IMPRESSION:  Abnormal distal esophagus  consistent with history short segment Barrett's-status post biopsy. Noncritical Schatzki's ring-status post Maloney dilation. Small hiatal hernia.  RECOMMENDATIONS: Continue omeprazole 20 mg daily. Further recommendations to follow pending review of pathology report    _______________________________ R. Roetta Sessions, MD FACP [facility] eSigned:  R. Roetta Sessions, MD FACP [facility] 08/21/2012 9:21 AM     CC:

## 2012-08-27 ENCOUNTER — Encounter (HOSPITAL_COMMUNITY): Payer: Self-pay | Admitting: Internal Medicine

## 2012-08-27 ENCOUNTER — Encounter: Payer: Self-pay | Admitting: Internal Medicine

## 2012-08-28 ENCOUNTER — Encounter: Payer: Self-pay | Admitting: *Deleted

## 2012-08-28 ENCOUNTER — Telehealth: Payer: Self-pay

## 2012-08-28 MED ORDER — AMOXICILL-CLARITHRO-LANSOPRAZ PO MISC: Freq: Two times a day (BID) | ORAL | Status: DC

## 2012-08-28 NOTE — Telephone Encounter (Signed)
Letter from: Corbin Ade   Reason for Letter: Results Review   Send letter to patient.  Send copy of letter with path to referring provider and PCP.  To Raynelle Fanning:   Needs prepak x 14 days; hold omeprazole and zocor during hp treatment , then resume

## 2012-08-28 NOTE — Telephone Encounter (Signed)
Pt is aware. [facility]

## 2012-08-28 NOTE — Telephone Encounter (Signed)
Tried to call pt- LMOM 

## 2013-12-19 ENCOUNTER — Other Ambulatory Visit: Payer: Self-pay | Admitting: Neurosurgery

## 2013-12-19 DIAGNOSIS — M4712 Other spondylosis with myelopathy, cervical region: Secondary | ICD-10-CM

## 2013-12-27 ENCOUNTER — Inpatient Hospital Stay
Admission: RE | Admit: 2013-12-27 | Discharge: 2013-12-27 | Disposition: A | Discharge status: Home / Self Care | Payer: Medicare Other | Source: Ambulatory Visit | Attending: Neurosurgery | Admitting: Neurosurgery

## 2013-12-27 ENCOUNTER — Ambulatory Visit
Admission: RE | Admit: 2013-12-27 | Discharge: 2013-12-27 | Disposition: A | Discharge status: Home / Self Care | Payer: Medicare Other | Source: Ambulatory Visit | Attending: Neurosurgery | Admitting: Neurosurgery

## 2013-12-27 DIAGNOSIS — M4712 Other spondylosis with myelopathy, cervical region: Secondary | ICD-10-CM

## 2014-01-10 ENCOUNTER — Telehealth: Payer: Self-pay

## 2014-01-10 NOTE — Telephone Encounter (Signed)
Gastroenterology Pre-Procedure Review  Request Date: 01/10/2014 Requesting Physician: Dr. Woody Seller  PATIENT REVIEW QUESTIONS: The patient responded to the following health history questions as indicated:    1. Diabetes Melitis: no 2. Joint replacements in the past 12 months: no 3. Major health problems in the past 3 months: no 4. Has an artificial valve or MVP: no 5. Has a defibrillator: no 6. Has been advised in past to take antibiotics in advance of a procedure like teeth cleaning: YES/ HE HAD BOTH KNEES REPLACED IN 2010  PT DRINKS ABOUT 2 BEERS DAILY    MEDICATIONS & ALLERGIES:    Patient reports the following regarding taking any blood thinners:   Plavix? no Aspirin? yes Coumadin? no  Patient confirms/reports the following medications:  Current Outpatient Prescriptions  Medication Sig Dispense Refill  . amLODipine (NORVASC) 10 MG tablet Take 10 mg by mouth daily.        Marland Kitchen aspirin 81 MG tablet Take 81 mg by mouth daily.        . fexofenadine (ALLEGRA) 180 MG tablet Take 180 mg by mouth daily. Takes only occasionally      . fish oil-omega-3 fatty acids 1000 MG capsule Take 1 g by mouth 2 (two) times daily.       . meloxicam (MOBIC) 7.5 MG tablet Take 7.5 mg by mouth 2 (two) times daily.       Marland Kitchen omeprazole (PRILOSEC) 20 MG capsule Take 20 mg by mouth daily.        . simvastatin (ZOCOR) 20 MG tablet Take 20 mg by mouth at bedtime.        . valsartan-hydrochlorothiazide (DIOVAN-HCT) 80-12.5 MG per tablet Take 1 tablet by mouth daily.         No current facility-administered medications for this visit.    Patient confirms/reports the following allergies:  No Known Allergies  No orders of the defined types were placed in this encounter.    AUTHORIZATION INFORMATION Primary Insurance:  ID #:   Group #:  Pre-Cert / Auth required: Pre-Cert / Auth #:   Secondary Insurance:   ID #:  Group #:  Pre-Cert / Auth required:  Pre-Cert / Auth #:   SCHEDULE INFORMATION: Procedure has  been scheduled as follows:  Date: 02/04/2014     Time: 7:30 AM   Location: [facility] Short Stay   This Gastroenterology Pre-Precedure Review Form is being routed to the following provider(s): R. Garfield Cornea, MD

## 2014-01-16 NOTE — Telephone Encounter (Signed)
Prophylactic abx not indicated.  Phenergan 12.5 mg IV on call

## 2014-01-17 ENCOUNTER — Other Ambulatory Visit: Payer: Self-pay

## 2014-01-17 DIAGNOSIS — Z1211 Encounter for screening for malignant neoplasm of colon: Secondary | ICD-10-CM

## 2014-01-18 ENCOUNTER — Other Ambulatory Visit: Payer: Self-pay

## 2014-01-18 ENCOUNTER — Other Ambulatory Visit: Payer: Self-pay | Admitting: Internal Medicine

## 2014-01-18 DIAGNOSIS — Z1211 Encounter for screening for malignant neoplasm of colon: Secondary | ICD-10-CM

## 2014-01-18 MED ORDER — PEG-KCL-NACL-NASULF-NA ASC-C 100 G PO SOLR: 1.0000 | ORAL | Status: DC

## 2014-01-18 NOTE — Telephone Encounter (Signed)
Rx sent to the pharmacy and instructions mailed to pt. Pt is aware no antibiotic needed.

## 2014-01-23 ENCOUNTER — Encounter (HOSPITAL_COMMUNITY): Payer: Self-pay | Admitting: Pharmacy Technician

## 2014-02-01 MED ORDER — SODIUM CHLORIDE 0.9 % IV SOLN: INTRAVENOUS | Status: DC

## 2014-02-04 ENCOUNTER — Ambulatory Visit (HOSPITAL_COMMUNITY)
Admission: RE | Admit: 2014-02-04 | Discharge: 2014-02-04 | Disposition: A | Discharge status: Home / Self Care | Payer: Medicare Other | Source: Ambulatory Visit | Attending: Internal Medicine | Admitting: Internal Medicine

## 2014-02-04 ENCOUNTER — Encounter (HOSPITAL_COMMUNITY)
Admission: RE | Disposition: A | Discharge status: Home / Self Care | Payer: Self-pay | Source: Ambulatory Visit | Attending: Internal Medicine

## 2014-02-04 ENCOUNTER — Encounter (HOSPITAL_COMMUNITY): Payer: Self-pay

## 2014-02-04 DIAGNOSIS — K219 Gastro-esophageal reflux disease without esophagitis: Secondary | ICD-10-CM | POA: Insufficient documentation

## 2014-02-04 DIAGNOSIS — Z79899 Other long term (current) drug therapy: Secondary | ICD-10-CM | POA: Insufficient documentation

## 2014-02-04 DIAGNOSIS — Z1211 Encounter for screening for malignant neoplasm of colon: Secondary | ICD-10-CM | POA: Insufficient documentation

## 2014-02-04 DIAGNOSIS — I2510 Atherosclerotic heart disease of native coronary artery without angina pectoris: Secondary | ICD-10-CM | POA: Insufficient documentation

## 2014-02-04 DIAGNOSIS — M1990 Unspecified osteoarthritis, unspecified site: Secondary | ICD-10-CM | POA: Insufficient documentation

## 2014-02-04 DIAGNOSIS — I10 Essential (primary) hypertension: Secondary | ICD-10-CM | POA: Insufficient documentation

## 2014-02-04 DIAGNOSIS — K648 Other hemorrhoids: Secondary | ICD-10-CM | POA: Insufficient documentation

## 2014-02-04 DIAGNOSIS — Z87891 Personal history of nicotine dependence: Secondary | ICD-10-CM | POA: Insufficient documentation

## 2014-02-04 DIAGNOSIS — Z7982 Long term (current) use of aspirin: Secondary | ICD-10-CM | POA: Insufficient documentation

## 2014-02-04 HISTORY — PX: COLONOSCOPY: SHX5424

## 2014-02-04 SURGERY — COLONOSCOPY
Anesthesia: Moderate Sedation

## 2014-02-04 MED ORDER — STERILE WATER FOR IRRIGATION IR SOLN
Status: DC | PRN
  Administered 2014-02-04: 08:00:00

## 2014-02-04 MED ORDER — MEPERIDINE HCL 100 MG/ML IJ SOLN
INTRAMUSCULAR | Status: DC
Start: 2014-02-04 — End: 2014-02-04
  Filled 2014-02-04: qty 2

## 2014-02-04 MED ORDER — ONDANSETRON HCL 4 MG/2ML IJ SOLN
INTRAMUSCULAR | Status: AC
  Filled 2014-02-04: qty 2

## 2014-02-04 MED ORDER — MIDAZOLAM HCL 5 MG/5ML IJ SOLN
INTRAMUSCULAR | Status: DC | PRN
  Administered 2014-02-04: 2 mg via INTRAVENOUS

## 2014-02-04 MED ORDER — PROMETHAZINE HCL 25 MG/ML IJ SOLN
12.5000 mg | Freq: Once | INTRAMUSCULAR | Status: AC
  Administered 2014-02-04: 12.5 mg via INTRAVENOUS
  Filled 2014-02-04: qty 1

## 2014-02-04 MED ORDER — MEPERIDINE HCL 100 MG/ML IJ SOLN
INTRAMUSCULAR | Status: DC | PRN
  Administered 2014-02-04: 50 mg via INTRAVENOUS

## 2014-02-04 MED ORDER — SODIUM CHLORIDE 0.9 % IV SOLN
INTRAVENOUS | Status: DC
  Administered 2014-02-04: 07:00:00 via INTRAVENOUS

## 2014-02-04 MED ORDER — MIDAZOLAM HCL 5 MG/5ML IJ SOLN
INTRAMUSCULAR | Status: AC
  Filled 2014-02-04: qty 10

## 2014-02-04 MED ORDER — ONDANSETRON HCL 4 MG/2ML IJ SOLN
INTRAMUSCULAR | Status: DC | PRN
  Administered 2014-02-04: 4 mg via INTRAVENOUS

## 2014-02-04 NOTE — H&P (Signed)
Primary Care Physician:  Glenda Chroman., MD Primary Gastroenterologist:  Dr. Gala Romney  Pre-Procedure History & Physical: HPI:  Todd Nelson is a 74 y.o. male is here for a screening colonoscopy. Last colonoscopy 10 years ago-negative. No bowel symptoms. Family history of colorectal neoplasm.  Past Medical History  Diagnosis Date  . Arthritis   . Barrett esophagus 5/09    first dx on EGD   . Schatzki's ring 5/09    s/p dilation  . Umbilical hernia   . CAD (coronary artery disease)   . Hyperchloremia   . HTN (hypertension)   . GERD (gastroesophageal reflux disease)   . Alcohol use   . Personal history of tobacco use, presenting hazards to health   . Hemorrhoids, external   . Osteoarthritis     Past Surgical History  Procedure Laterality Date  . Colonoscopy  2005    hyperplastic polyps,hemorrhoids  . Appendectomy    . Tonsillectomy    . Neck surgery  2008    plate  . Esophagogastroduodenoscopy  02/06/08    RMR: short-segment Barrett's, Schatzki's ring s/p dilation   . Knee surgery  2011    both knees-Bil knee replacement  . Esophagogastroduodenoscopy  July 2010    RMR: short-segment Barrett's  . Esophagogastroduodenoscopy (egd) with esophageal dilation  08/21/2012    Procedure: ESOPHAGOGASTRODUODENOSCOPY (EGD) WITH ESOPHAGEAL DILATION;  Surgeon: Daneil Dolin, MD;  Location: [facility] END[facility];  Service: Endoscopy;  Laterality: N/A;  8:45/GIVE PHENERGAN 12.5MG IV 30 MINS PRIOR TO PROCEDURE    Prior to Admission medications   Medication Sig Start Date End Date Taking? Authorizing Provider  amLODipine (NORVASC) 10 MG tablet Take 10 mg by mouth daily.     Yes Historical Provider, MD  aspirin 81 MG tablet Take 81 mg by mouth daily.     Yes Historical Provider, MD  fexofenadine (ALLEGRA) 180 MG tablet Take 180 mg by mouth daily as needed for allergies.    Yes Historical Provider, MD  fish oil-omega-3 fatty acids 1000 MG capsule Take 1 g by mouth 2 (two) times daily.    Yes Historical  Provider, MD  meloxicam (MOBIC) 7.5 MG tablet Take 7.5 mg by mouth 2 (two) times daily.    Yes Historical Provider, MD  omeprazole (PRILOSEC) 20 MG capsule Take 20 mg by mouth daily.     Yes Historical Provider, MD  peg 3350 powder (MOVIPREP) 100 G SOLR Take 1 kit (200 g total) by mouth as directed. 01/18/14  Yes Daneil Dolin, MD  simvastatin (ZOCOR) 20 MG tablet Take 20 mg by mouth at bedtime.     Yes Historical Provider, MD  valsartan-hydrochlorothiazide (DIOVAN-HCT) 80-12.5 MG per tablet Take 1 tablet by mouth daily.     Yes Historical Provider, MD    Allergies as of 01/18/2014  . (No Known Allergies)    Family History  Problem Relation Age of Onset  . Pancreatic cancer Father 64    History   Social History  . Marital Status: Married    Spouse Name: N/A    Number of Children: N/A  . Years of Education: N/A   Occupational History  . retired     from [place]  . part-time     [employer]    Social History Main Topics  . Smoking status: Former Smoker -- 1.00 packs/day for 20 years    Types: Cigarettes    Quit date: 09/27/1984  . Smokeless tobacco: Never Used  . Alcohol Use: Yes  Comment: 3 beers a day  . Drug Use: No  . Sexual Activity: Yes    Birth Control/ Protection: None   Other Topics Concern  . Not on file   Social History Narrative  . No narrative on file    Review of Systems: See HPI, otherwise negative ROS  Physical Exam: BP 138/83  Pulse 73  Temp(Src) 97.9 F (36.6 C) (Oral)  Resp 19  Ht _0  (1.778 m)  Wt 184 lb (83.462 kg)  BMI 26.40 kg/m2  SpO2 96% General:   Alert,  Well-developed, well-nourished, pleasant and cooperative in NAD Head:  Normocephalic and atraumatic. Eyes:  Sclera clear, no icterus.   Conjunctiva pink. Ears:  Normal auditory acuity. Nose:  No deformity, discharge,  or lesions. Mouth:  No deformity or lesions, dentition normal. Neck:  Supple; no masses or thyromegaly. Lungs:  Clear throughout to auscultation.   No  wheezes, crackles, or rhonchi. No acute distress. Heart:  Regular rate and rhythm; no murmurs, clicks, rubs,  or gallops. Abdomen:  Soft, nontender and nondistended. No masses, hepatosplenomegaly or hernias noted. Normal bowel sounds, without guarding, and without rebound.   Msk:  Symmetrical without gross deformities. Normal posture. Pulses:  Normal pulses noted. Extremities:  Without clubbing or edema. Neurologic:  Alert and  oriented x4;  grossly normal neurologically. Skin:  Intact without significant lesions or rashes. Cervical Nodes:  No significant cervical adenopathy. Psych:  Alert and cooperative. Normal mood and affect.  Impression/Plan: Todd Nelson is now here to undergo a screening colonoscopy.   Average risk screening examination  Risks, benefits, limitations, imponderables and alternatives regarding colonoscopy have been reviewed with the patient. Questions have been answered. All parties agreeable.

## 2014-02-04 NOTE — Discharge Instructions (Signed)
°  Colonoscopy Discharge Instructions  Read the instructions outlined below and refer to this sheet in the next few weeks. These discharge instructions provide you with general information on caring for yourself after you leave the hospital. Your doctor may also give you specific instructions. While your treatment has been planned according to the most current medical practices available, unavoidable complications occasionally occur. If you have any problems or questions after discharge, call Dr. Gala Romney at [phone]. ACTIVITY  You may resume your regular activity, but move at a slower pace for the next 24 hours.   Take frequent rest periods for the next 24 hours.   Walking will help get rid of the air and reduce the bloated feeling in your belly (abdomen).   No driving for 24 hours (because of the medicine (anesthesia) used during the test).    Do not sign any important legal documents or operate any machinery for 24 hours (because of the anesthesia used during the test).  NUTRITION  Drink plenty of fluids.   You may resume your normal diet as instructed by your doctor.   Begin with a light meal and progress to your normal diet. Heavy or fried foods are harder to digest and may make you feel sick to your stomach (nauseated).   Avoid alcoholic beverages for 24 hours or as instructed.  MEDICATIONS  You may resume your normal medications unless your doctor tells you otherwise.  WHAT YOU CAN EXPECT TODAY  Some feelings of bloating in the abdomen.   Passage of more gas than usual.   Spotting of blood in your stool or on the toilet paper.  IF YOU HAD POLYPS REMOVED DURING THE COLONOSCOPY:  No aspirin products for 7 days or as instructed.   No alcohol for 7 days or as instructed.   Eat a soft diet for the next 24 hours.  FINDING OUT THE RESULTS OF YOUR TEST Not all test results are available during your visit. If your test results are not back during the visit, make an appointment  with your caregiver to find out the results. Do not assume everything is normal if you have not heard from your caregiver or the medical facility. It is important for you to follow up on all of your test results.  SEEK IMMEDIATE MEDICAL ATTENTION IF:  You have more than a spotting of blood in your stool.   Your belly is swollen (abdominal distention).   You are nauseated or vomiting.   You have a temperature over 101.   You have abdominal pain or discomfort that is severe or gets worse throughout the day.     Consider one more screening colonoscopy in 10 years only if overall health remains very good

## 2014-02-04 NOTE — Op Note (Signed)
[facility] [address] [place], [address]   COLONOSCOPY PROCEDURE REPORT  PATIENT: Todd Nelson, Todd Nelson  MR#:         [id number] BIRTHDATE: 1941-04-13 , 73  yrs. old GENDER: Male ENDOSCOPIST: R.  Garfield Cornea, MD FACP FACG REFERRED BY:  Jerene Bears, M.D. PROCEDURE DATE:  02/04/2014 PROCEDURE:     screening colonoscopy  INDICATIONS: Average risk colorectal cancer screening examination; last examination 10 years ago  INFORMED CONSENT:  The risks, benefits, alternatives and imponderables including but not limited to bleeding, perforation as well as the possibility of a missed lesion have been reviewed.  The potential for biopsy, lesion removal, etc. have also been discussed.  Questions have been answered.  All parties agreeable. Please see the history and physical in the medical record for more information.  MEDICATIONS: Versed 2 mg IV and Demerol 50 mg IV in divided doses. Zofran 4 mg IV. Phenergan 12.5 mg IV.  DESCRIPTION OF PROCEDURE:  After a digital rectal exam was performed, the     colonoscope was advanced from the anus through the rectum and colon to the area of the cecum, ileocecal valve and appendiceal orifice.  The cecum was deeply intubated.  These structures were well-seen and photographed for the record.  From the level of the cecum and ileocecal valve, the scope was slowly and cautiously withdrawn.  The mucosal surfaces were carefully surveyed utilizing scope tip deflection to facilitate fold flattening as needed.  The scope was pulled down into the rectum where a thorough examination including retroflexion was performed.     FINDINGS:  Adequate preparation. Internal hemorrhoids; otherwise, normal rectum. Normal-appearing colonic mucosa. The distal 5 cm of terminal ileal mucosa also appeared normal.  THERAPEUTIC / DIAGNOSTIC MANEUVERS PERFORMED:  none  COMPLICATIONS: none  CECAL WITHDRAWAL TIME:  9 minutes  IMPRESSION:  Normal  ileocolonoscopy  RECOMMENDATIONS: Would consider one more screening colonoscopy in 10 years only if overall health permits.   _______________________________ eSigned:  R. Garfield Cornea, MD FACP [facility] 02/04/2014 8:57 AM   CC:    PATIENT NAME:  Kinser, Fellman MR#: [id number]

## 2014-02-06 ENCOUNTER — Encounter (HOSPITAL_COMMUNITY): Payer: Self-pay | Admitting: Internal Medicine

## 2014-07-24 ENCOUNTER — Ambulatory Visit (INDEPENDENT_AMBULATORY_CARE_PROVIDER_SITE_OTHER): Payer: Medicare Other | Admitting: Cardiovascular Disease

## 2014-07-24 ENCOUNTER — Encounter: Payer: Self-pay | Admitting: Cardiovascular Disease

## 2014-07-24 VITALS — BP 112/63 | HR 69 | Ht 70.0 in | Wt 181.0 lb

## 2014-07-24 DIAGNOSIS — R002 Palpitations: Secondary | ICD-10-CM

## 2014-07-24 DIAGNOSIS — Z23 Encounter for immunization: Secondary | ICD-10-CM

## 2014-07-24 DIAGNOSIS — I10 Essential (primary) hypertension: Secondary | ICD-10-CM

## 2014-07-24 DIAGNOSIS — I2510 Atherosclerotic heart disease of native coronary artery without angina pectoris: Secondary | ICD-10-CM

## 2014-07-24 NOTE — Patient Instructions (Signed)
Your physician recommends that you schedule a follow-up appointment in: 3-4 months. Your physician recommends that you continue on your current medications as directed. Please refer to the Current Medication list given to you today.

## 2014-07-24 NOTE — Progress Notes (Signed)
Patient ID: Todd Nelson, male   DOB: 05-30-1941, 73 y.o.   MRN: [id number]      SUBJECTIVE: The patient is a 73 year old male with a history of single-vessel coronary artery disease with a 40% left main lesion. He has no other significant coronary artery disease. He also has HTN and a former h/o tobacco abuse. He had an echocardiogram performed on 07/01/2014 at an outside facility which reportedly demonstrated normal left ventricular systolic function, EF 84-66%, mild LVH, diastolic dysfunction, aortic valve sclerosis without stenosis, moderate mitral regurgitation, and mild tricuspid and pulmonic regurgitation. An ECG performed on 06/25/2014 demonstrated normal sinus rhythm with no ischemic ST segment or T-wave abnormalities, heart rate 60 bpm. He recently experienced one episode of palpitations. He had just lied down to sleep one evening and his heart rate became elevated, and was as high as 154 bpm. Blood pressure was normal. He rechecked it a few minutes later and heart rate was 77 bpm. This episode lasted 10-15 minutes. He was started on Toprol-XL by his primary care physician, Dr. Woody Seller, and a TSH was ordered, which was reportedly normal. He denies exertional chest pain, shortness of breath, lightheadedness, dizziness, leg swelling, and syncope. He said he is "moderately active". He does yard work at home. 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 He occasionally has to lift heavy objects and denies any exertional chest pain or shortness of breath with this. He has not had any palpitations prior to this isolated episode and none since. He quit smoking in 1984.    Review of Systems: As per "subjective", otherwise negative.  No Known Allergies  Current Outpatient Prescriptions  Medication Sig Dispense Refill  . amLODipine (NORVASC) 10 MG tablet Take 10 mg by mouth daily.        Marland Kitchen aspirin 81 MG tablet Take 81 mg by mouth daily.        . fexofenadine  (ALLEGRA) 180 MG tablet Take 180 mg by mouth daily as needed for allergies.       . fish oil-omega-3 fatty acids 1000 MG capsule Take 1 g by mouth 2 (two) times daily.       . meloxicam (MOBIC) 7.5 MG tablet Take 7.5 mg by mouth 2 (two) times daily.       Marland Kitchen omeprazole (PRILOSEC) 20 MG capsule Take 20 mg by mouth daily.        . peg 3350 powder (MOVIPREP) 100 G SOLR Take 1 kit (200 g total) by mouth as directed.  1 kit  0  . simvastatin (ZOCOR) 20 MG tablet Take 20 mg by mouth at bedtime.        . valsartan-hydrochlorothiazide (DIOVAN-HCT) 80-12.5 MG per tablet Take 1 tablet by mouth daily.         No current facility-administered medications for this visit.    Past Medical History  Diagnosis Date  . Arthritis   . Barrett esophagus 5/09    first dx on EGD   . Schatzki's ring 5/09    s/p dilation  . Umbilical hernia   . CAD (coronary artery disease)   . Hyperchloremia   . HTN (hypertension)   . GERD (gastroesophageal reflux disease)   . Alcohol use   . Personal history of tobacco use, presenting hazards to health   . Hemorrhoids, external   . Osteoarthritis     Past Surgical History  Procedure Laterality Date  . Colonoscopy  2005    hyperplastic polyps,hemorrhoids  .  Appendectomy    . Tonsillectomy    . Neck surgery  2008    plate  . Esophagogastroduodenoscopy  02/06/08    RMR: short-segment Barrett's, Schatzki's ring s/p dilation   . Knee surgery  2011    both knees-Bil knee replacement  . Esophagogastroduodenoscopy  July 2010    RMR: short-segment Barrett's  . Esophagogastroduodenoscopy (egd) with esophageal dilation  08/21/2012    Procedure: ESOPHAGOGASTRODUODENOSCOPY (EGD) WITH ESOPHAGEAL DILATION;  Surgeon: Daneil Dolin, MD;  [facility];  Service: Endoscopy;  Laterality: N/A;  8:45/GIVE PHENERGAN 12.5MG IV 30 MINS PRIOR TO PROCEDURE  . Colonoscopy N/A 02/04/2014    Procedure: COLONOSCOPY;  Surgeon: Daneil Dolin, MD;  Location: [facility];  Service:  Endoscopy;  Laterality: N/A;  7:30 AM    History   Social History  . Marital Status: Married    Spouse Name: N/A    Number of Children: N/A  . Years of Education: N/A   Occupational History  . retired     from [place]  . part-time     [employer]    Social History Main Topics  . Smoking status: Former Smoker -- 1.00 packs/day for 20 years    Types: Cigarettes    Quit date: 09/27/1984  . Smokeless tobacco: Never Used  . Alcohol Use: Yes     Comment: 3 beers a day  . Drug Use: No  . Sexual Activity: Yes    Birth Control/ Protection: None   Other Topics Concern  . Not on file   Social History Narrative  . No narrative on file    BP 112/63  Pulse 69  SpO2 97% Weight 181 lb (82.101 kg) Height 5' 10" (1.778 m)    PHYSICAL EXAM General: NAD HEENT: Normal. Neck: No JVD, no thyromegaly. Lungs: Clear to auscultation bilaterally with normal respiratory effort. CV: Nondisplaced PMI.  Regular rate and rhythm, normal S1/S2, no S3/S4, no murmur. No pretibial or periankle edema.  No carotid bruit.  Normal pedal pulses.  Abdomen: Soft, nontender, no hepatosplenomegaly, no distention.  Neurologic: Alert and oriented x 3.  Psych: Normal affect. Skin: Normal. Musculoskeletal: Normal range of motion, no gross deformities. Extremities: No clubbing or cyanosis.   ECG: Most recent ECG reviewed.      ASSESSMENT AND PLAN: 1. Palpitations: Recently started on Toprol-XL 25 mg daily, with no recurrences thus far. If he were to have recurrences, I would proceed with event monitoring. A heart rate of 154 bpm could be consistent with inappropriate sinus tachycardia, atrial flutter, atrial fibrillation, or AV nodal reentrant tachycardia, but this is all speculative. ECG performed in the office today demonstrates normal sinus rhythm with no abnormalities. 2. CAD: Stable ischemic heart disease. On ASA, statin, and beta blocker. No changes to therapy indicated. 3. Essential HTN: Well controlled  on current therapy which includes amlodipine and valsartan-HCTZ. No changes are required.  Dispo: f/u 3-4 months.  Kate Sable, M.D., F.A.C.C.

## 2014-10-17 ENCOUNTER — Ambulatory Visit (INDEPENDENT_AMBULATORY_CARE_PROVIDER_SITE_OTHER): Payer: Medicare Other | Admitting: Cardiovascular Disease

## 2014-10-17 ENCOUNTER — Encounter: Payer: Self-pay | Admitting: Cardiovascular Disease

## 2014-10-17 VITALS — BP 130/71 | HR 71 | Ht 70.0 in | Wt 183.0 lb

## 2014-10-17 DIAGNOSIS — I10 Essential (primary) hypertension: Secondary | ICD-10-CM | POA: Diagnosis not present

## 2014-10-17 DIAGNOSIS — R002 Palpitations: Secondary | ICD-10-CM

## 2014-10-17 DIAGNOSIS — I2510 Atherosclerotic heart disease of native coronary artery without angina pectoris: Secondary | ICD-10-CM | POA: Diagnosis not present

## 2014-10-17 NOTE — Patient Instructions (Signed)

## 2014-10-17 NOTE — Progress Notes (Signed)
Patient ID: Todd Nelson, male   DOB: 1941/08/17, 74 y.o.   MRN: [id number]      SUBJECTIVE: The patient presents for follow-up of palpitations, coronary artery disease, and hypertension. He has a history of single-vessel coronary artery disease with a 40% left main lesion. He has no other significant coronary artery disease. He also has HTN and a former h/o tobacco abuse. He had an echocardiogram performed on 07/01/2014 at an outside facility which reportedly demonstrated normal left ventricular systolic function, EF 37-10%, mild LVH, diastolic dysfunction, aortic valve sclerosis without stenosis, moderate mitral regurgitation, and mild tricuspid and pulmonic regurgitation.  He has been doing well and denies chest pain, shortness of breath, and palpitations.   Review of Systems: As per "subjective", otherwise negative.  No Known Allergies  Current Outpatient Prescriptions  Medication Sig Dispense Refill  . amLODipine (NORVASC) 10 MG tablet Take 10 mg by mouth daily.      Marland Kitchen aspirin 81 MG tablet Take 81 mg by mouth daily.      . fexofenadine (ALLEGRA) 180 MG tablet Take 180 mg by mouth daily as needed for allergies.     . fish oil-omega-3 fatty acids 1000 MG capsule Take 1 g by mouth 2 (two) times daily.     . meloxicam (MOBIC) 7.5 MG tablet Take 7.5 mg by mouth 2 (two) times daily.     . metoprolol succinate (TOPROL-XL) 25 MG 24 hr tablet Take 25 mg by mouth daily.    Marland Kitchen omeprazole (PRILOSEC) 20 MG capsule Take 20 mg by mouth daily.      . simvastatin (ZOCOR) 20 MG tablet Take 20 mg by mouth at bedtime.      . valsartan-hydrochlorothiazide (DIOVAN-HCT) 80-12.5 MG per tablet Take 1 tablet by mouth daily.       No current facility-administered medications for this visit.    Past Medical History  Diagnosis Date  . Arthritis   . Barrett esophagus 5/09    first dx on EGD   . Schatzki's ring 5/09    s/p dilation  . Umbilical hernia   . CAD (coronary artery disease)   . Hyperchloremia    . HTN (hypertension)   . GERD (gastroesophageal reflux disease)   . Alcohol use   . Personal history of tobacco use, presenting hazards to health   . Hemorrhoids, external   . Osteoarthritis     Past Surgical History  Procedure Laterality Date  . Colonoscopy  2005    hyperplastic polyps,hemorrhoids  . Appendectomy    . Tonsillectomy    . Neck surgery  2008    plate  . Esophagogastroduodenoscopy  02/06/08    RMR: short-segment Barrett's, Schatzki's ring s/p dilation   . Knee surgery  2011    both knees-Bil knee replacement  . Esophagogastroduodenoscopy  July 2010    RMR: short-segment Barrett's  . Esophagogastroduodenoscopy (egd) with esophageal dilation  08/21/2012    Procedure: ESOPHAGOGASTRODUODENOSCOPY (EGD) WITH ESOPHAGEAL DILATION;  Surgeon: Daneil Dolin, MD;  Location: [facility];  Service: Endoscopy;  Laterality: N/A;  8:45/GIVE PHENERGAN 12.5MG  IV 30 MINS PRIOR TO PROCEDURE  . Colonoscopy N/A 02/04/2014    Procedure: COLONOSCOPY;  Surgeon: Daneil Dolin, MD;  Location: [facility];  Service: Endoscopy;  Laterality: N/A;  7:30 AM    History   Social History  . Marital Status: Married    Spouse Name: N/A    Number of Children: N/A  . Years of Education: N/A   Occupational History  .  retired     from [employer]  . part-time     [employer]    Social History Main Topics  . Smoking status: Former Smoker -- 1.00 packs/day for 20 years    Types: Cigarettes    Start date: 07/24/1954    Quit date: 09/27/1984  . Smokeless tobacco: Never Used  . Alcohol Use: 0.0 oz/week    0 Not specified per week     Comment: 3 beers a day  . Drug Use: No  . Sexual Activity: Yes    Birth Control/ Protection: None   Other Topics Concern  . Not on file   Social History Narrative     Filed Vitals:   10/17/14 1358  BP: 130/71  Pulse: 71  Height: 5\' 10"  (1.778 m)  Weight: 183 lb (83.008 kg)    PHYSICAL EXAM General: NAD HEENT: Normal. Neck: No JVD, no  thyromegaly. Lungs: Clear to auscultation bilaterally with normal respiratory effort. CV: Nondisplaced PMI.  Regular rate and rhythm, normal S1/S2, no S3/S4, no murmur. No pretibial or periankle edema.  No carotid bruit.  Normal pedal pulses.  Abdomen: Soft, nontender, no hepatosplenomegaly, no distention.  Neurologic: Alert and oriented x 3.  Psych: Normal affect. Skin: Normal. Musculoskeletal: Normal range of motion, no gross deformities. Extremities: No clubbing or cyanosis.   ECG: Most recent ECG reviewed.      ASSESSMENT AND PLAN: 1. Palpitations: Symptomatically stable on Toprol-XL 25 mg daily, with no recurrences thus far. If he were to have recurrences, I would proceed with event monitoring. A heart rate of 154 bpm could be consistent with inappropriate sinus tachycardia, atrial flutter, atrial fibrillation, or AV nodal reentrant tachycardia, but this is all speculative. ECG performed at his last visit demonstrated normal sinus rhythm with no abnormalities. 2. CAD: Stable ischemic heart disease. On ASA, statin, and beta blocker. No changes to therapy indicated. 3. Essential HTN: Well controlled on current therapy which includes amlodipine and valsartan-HCTZ. No changes are required.  Dispo: f/u 6 months.   Kate Sable, M.D., F.A.C.C.

## 2014-10-18 ENCOUNTER — Ambulatory Visit: Payer: Medicare Other | Admitting: Cardiovascular Disease

## 2015-03-28 ENCOUNTER — Ambulatory Visit (INDEPENDENT_AMBULATORY_CARE_PROVIDER_SITE_OTHER): Payer: Medicare Other | Admitting: Cardiovascular Disease

## 2015-03-28 ENCOUNTER — Encounter: Payer: Self-pay | Admitting: Cardiovascular Disease

## 2015-03-28 VITALS — BP 130/78 | HR 68 | Ht 70.0 in | Wt 183.0 lb

## 2015-03-28 DIAGNOSIS — R002 Palpitations: Secondary | ICD-10-CM

## 2015-03-28 DIAGNOSIS — I10 Essential (primary) hypertension: Secondary | ICD-10-CM

## 2015-03-28 DIAGNOSIS — I2510 Atherosclerotic heart disease of native coronary artery without angina pectoris: Secondary | ICD-10-CM | POA: Diagnosis not present

## 2015-03-28 NOTE — Patient Instructions (Signed)
Continue all current medications. Your physician wants you to follow up in:  1 year.  You will receive a reminder letter in the mail one-two months in advance.  If you don't receive a letter, please call our office to schedule the follow up appointment   

## 2015-03-28 NOTE — Progress Notes (Signed)
Patient ID: Todd Nelson, male   DOB: [birth year], 74 y.o.   MRN: [id number]      SUBJECTIVE: The patient presents for follow-up of palpitations, coronary artery disease, and hypertension. He has a history of single-vessel coronary artery disease with a 40% left main lesion. He has no other significant coronary artery disease. He also has HTN and a former h/o tobacco abuse. He had an echocardiogram performed on 07/01/2014 at an outside facility which reportedly demonstrated normal left ventricular systolic function, EF 19-62%, mild LVH, diastolic dysfunction, aortic valve sclerosis without stenosis, moderate mitral regurgitation, and mild tricuspid and pulmonic regurgitation.  He has been doing well and denies chest pain and shortness of breath. He had one episode of palpitations which lasted for 15 seconds approximately 4 weeks ago and spontaneously resolved.   Review of Systems: As per "subjective", otherwise negative.  No Known Allergies  Current Outpatient Prescriptions  Medication Sig Dispense Refill  . amLODipine (NORVASC) 10 MG tablet Take 10 mg by mouth daily.      Marland Kitchen aspirin 81 MG tablet Take 81 mg by mouth daily.      . fexofenadine (ALLEGRA) 180 MG tablet Take 180 mg by mouth daily as needed for allergies.     . fish oil-omega-3 fatty acids 1000 MG capsule Take 1 g by mouth 2 (two) times daily.     . meloxicam (MOBIC) 7.5 MG tablet Take 7.5 mg by mouth 2 (two) times daily.     . metoprolol succinate (TOPROL-XL) 25 MG 24 hr tablet Take 25 mg by mouth daily.    Marland Kitchen omeprazole (PRILOSEC) 20 MG capsule Take 20 mg by mouth daily.      . simvastatin (ZOCOR) 20 MG tablet Take 20 mg by mouth at bedtime.      . valsartan-hydrochlorothiazide (DIOVAN-HCT) 80-12.5 MG per tablet Take 1 tablet by mouth daily.       No current facility-administered medications for this visit.    Past Medical History  Diagnosis Date  . Arthritis   . Barrett esophagus 5/09    first dx on EGD   . Schatzki's  ring 5/09    s/p dilation  . Umbilical hernia   . CAD (coronary artery disease)   . Hyperchloremia   . HTN (hypertension)   . GERD (gastroesophageal reflux disease)   . Alcohol use   . Personal history of tobacco use, presenting hazards to health   . Hemorrhoids, external   . Osteoarthritis     Past Surgical History  Procedure Laterality Date  . Colonoscopy  2005    hyperplastic polyps,hemorrhoids  . Appendectomy    . Tonsillectomy    . Neck surgery  2008    plate  . Esophagogastroduodenoscopy  02/06/08    RMR: short-segment Barrett's, Schatzki's ring s/p dilation   . Knee surgery  2011    both knees-Bil knee replacement  . Esophagogastroduodenoscopy  July 2010    RMR: short-segment Barrett's  . Esophagogastroduodenoscopy (egd) with esophageal dilation  08/21/2012    Procedure: ESOPHAGOGASTRODUODENOSCOPY (EGD) WITH ESOPHAGEAL DILATION;  Surgeon: Daneil Dolin, MD;  [facility];  Service: Endoscopy;  Laterality: N/A;  8:45/GIVE PHENERGAN 12.5MG  IV 30 MINS PRIOR TO PROCEDURE  . Colonoscopy N/A 02/04/2014    Procedure: COLONOSCOPY;  Surgeon: Daneil Dolin, MD;  [facility];  Service: Endoscopy;  Laterality: N/A;  7:30 AM    History   Social History  . Marital Status: Married    Spouse Name: N/A  .  Number of Children: N/A  . Years of Education: N/A   Occupational History  . retired     from [place]  . part-time     [employer]    Social History Main Topics  . Smoking status: Former Smoker -- 1.00 packs/day for 20 years    Types: Cigarettes    Start date: 07/24/1954    Quit date: 09/27/1984  . Smokeless tobacco: Never Used  . Alcohol Use: 0.0 oz/week    0 Standard drinks or equivalent per week     Comment: 3 beers a day  . Drug Use: No  . Sexual Activity: Yes    Birth Control/ Protection: None   Other Topics Concern  . Not on file   Social History Narrative     Filed Vitals:   03/28/15 0910  BP: 130/78  Pulse: 68  Height: 5\' 10"   (1.778 m)  Weight: 183 lb (83.008 kg)  SpO2: 95%    PHYSICAL EXAM General: NAD HEENT: Normal. Neck: No JVD, no thyromegaly. Lungs: Clear to auscultation bilaterally with normal respiratory effort. CV: Nondisplaced PMI.  Regular rate and rhythm, normal S1/S2, no S3/S4, no murmur. No pretibial or periankle edema.  No carotid bruit.  Normal pedal pulses.  Abdomen: Soft, nontender, no hepatosplenomegaly, no distention.  Neurologic: Alert and oriented x 3.  Psych: Normal affect. Skin: Normal. Musculoskeletal: Normal range of motion, no gross deformities. Extremities: No clubbing or cyanosis.   ECG: Most recent ECG reviewed.      ASSESSMENT AND PLAN: 1. Palpitations: Symptomatically stable on Toprol-XL 25 mg daily, with no significatn recurrences thus far. If he were to have frequent recurrences, I would proceed with event monitoring. A prior heart rate of 154 bpm could be consistent with inappropriate sinus tachycardia, atrial flutter, atrial fibrillation, or AV nodal reentrant tachycardia, but this is all speculative. ECG performed at a prior visit demonstrated normal sinus rhythm with no abnormalities.  2. CAD: Stable ischemic heart disease. On ASA, statin, and beta blocker. No changes to therapy indicated.  3. Essential HTN: Well controlled on current therapy which includes amlodipine and valsartan-HCTZ. No changes are required.  Dispo: f/u 1 year.   Kate Sable, M.D., F.A.C.C.

## 2015-04-28 ENCOUNTER — Encounter: Payer: Self-pay | Admitting: *Deleted

## 2015-08-13 ENCOUNTER — Encounter: Payer: Self-pay | Admitting: Internal Medicine

## 2016-01-27 DIAGNOSIS — I10 Essential (primary) hypertension: Secondary | ICD-10-CM | POA: Diagnosis not present

## 2016-01-27 DIAGNOSIS — Z789 Other specified health status: Secondary | ICD-10-CM | POA: Diagnosis not present

## 2016-01-27 DIAGNOSIS — E7800 Pure hypercholesterolemia, unspecified: Secondary | ICD-10-CM | POA: Diagnosis not present

## 2016-02-24 ENCOUNTER — Ambulatory Visit (INDEPENDENT_AMBULATORY_CARE_PROVIDER_SITE_OTHER): Payer: Medicare Other | Admitting: Gastroenterology

## 2016-02-24 ENCOUNTER — Encounter: Payer: Self-pay | Admitting: Gastroenterology

## 2016-02-24 ENCOUNTER — Encounter (INDEPENDENT_AMBULATORY_CARE_PROVIDER_SITE_OTHER): Payer: Self-pay

## 2016-02-24 ENCOUNTER — Other Ambulatory Visit: Payer: Self-pay

## 2016-02-24 VITALS — BP 128/71 | HR 69 | Temp 97.6°F | Ht 70.0 in | Wt 183.0 lb

## 2016-02-24 DIAGNOSIS — K219 Gastro-esophageal reflux disease without esophagitis: Secondary | ICD-10-CM

## 2016-02-24 DIAGNOSIS — K2270 Barrett's esophagus without dysplasia: Secondary | ICD-10-CM

## 2016-02-24 NOTE — Progress Notes (Signed)
cc'ed to pcp °

## 2016-02-24 NOTE — Assessment & Plan Note (Addendum)
75 year old gentleman with history of short segment Barrett's who presents for surveillance EGD at this time. Clinically doing well. History of H. pylori gastritis, status post Prevpac 2013. EGD in the near future. Phenergan 12.5 mg IV on call due to history of daily alcohol.  I have discussed the risks, alternatives, benefits with regards to but not limited to the risk of reaction to medication, bleeding, infection, perforation and the patient is agreeable to proceed. Written consent to be obtained.  Continue Prilosec daily.

## 2016-02-24 NOTE — Patient Instructions (Signed)
1. Continue omeprazole 20 mg daily. 2. Upper endoscopy with Dr. Gala Romney, see separate instructions.

## 2016-02-24 NOTE — Progress Notes (Signed)
Primary Care Physician:  Glenda Chroman, MD  Primary Gastroenterologist:  Garfield Cornea, MD   Chief Complaint  Patient presents with  . EGD    HPI:  Todd Nelson is a 75 y.o. male here to schedule surveillance EGD given history of Barrett's esophagus.Last EGD in 2013. No evidence of dysplasia. He also was treated for H. pylori gastritis at that time. Patient clinically is doing well. He continues omeprazole 20 mg daily. Heartburn well-controlled. No dysphagia since EGD dilation 2013. No vomiting, abdominal pain, constipation, diarrhea, melena, rectal bleeding.  Current Outpatient Prescriptions  Medication Sig Dispense Refill  . amLODipine (NORVASC) 10 MG tablet Take 10 mg by mouth daily.      Marland Kitchen aspirin 81 MG tablet Take 81 mg by mouth daily.      . fexofenadine (ALLEGRA) 180 MG tablet Take 180 mg by mouth daily as needed for allergies.     . fish oil-omega-3 fatty acids 1000 MG capsule Take 1 g by mouth 2 (two) times daily.     . meloxicam (MOBIC) 7.5 MG tablet Take 7.5 mg by mouth 2 (two) times daily.     . metoprolol succinate (TOPROL-XL) 25 MG 24 hr tablet Take 25 mg by mouth daily.    Marland Kitchen omeprazole (PRILOSEC) 20 MG capsule Take 20 mg by mouth daily.      . simvastatin (ZOCOR) 20 MG tablet Take 20 mg by mouth at bedtime.      . valsartan-hydrochlorothiazide (DIOVAN-HCT) 80-12.5 MG per tablet Take 1 tablet by mouth daily.       No current facility-administered medications for this visit.    Allergies as of 02/24/2016  . (No Known Allergies)    Past Medical History  Diagnosis Date  . Arthritis   . Barrett esophagus 5/09    first dx on EGD   . Schatzki's ring 5/09    s/p dilation  . Umbilical hernia   . CAD (coronary artery disease)   . Hyperchloremia   . HTN (hypertension)   . GERD (gastroesophageal reflux disease)   . Alcohol use ([place])   . Personal history of tobacco use, presenting hazards to health   . Hemorrhoids, external   . Osteoarthritis     Past Surgical  History  Procedure Laterality Date  . Colonoscopy  2005    hyperplastic polyps,hemorrhoids  . Appendectomy    . Tonsillectomy    . Neck surgery  2008    plate  . Esophagogastroduodenoscopy  02/06/08    RMR: short-segment Barrett's, Schatzki's ring s/p dilation   . Knee surgery  2011    both knees-Bil knee replacement  . Esophagogastroduodenoscopy  July 2010    RMR: short-segment Barrett's  . Esophagogastroduodenoscopy (egd) with esophageal dilation  08/21/2012    RMR: noncritical Schatzki ring, small hh, short-segment Barretts with no dysplasia.H.pylori gastritis  . Colonoscopy N/A 02/04/2014    Rourk: normal. consider one more screening in 10 years    Family History  Problem Relation Age of Onset  . Pancreatic cancer Father 20    Social History   Social History  . Marital Status: Married    Spouse Name: N/A  . Number of Children: N/A  . Years of Education: N/A   Occupational History  . retired     from [employer]  . part-time     [employer]    Social History Main Topics  . Smoking status: Former Smoker -- 1.00 packs/day for 20 years    Types: Cigarettes  Start date: 07/24/1954    Quit date: 09/27/1984  . Smokeless tobacco: Never Used  . Alcohol Use: 0.0 oz/week    0 Standard drinks or equivalent per week     Comment: 3 beers a day  . Drug Use: No  . Sexual Activity: Yes    Birth Control/ Protection: None   Other Topics Concern  . Not on file   Social History Narrative      ROS:  General: Negative for anorexia, weight loss, fever, chills, fatigue, weakness. Eyes: Negative for vision changes.  ENT: Negative for hoarseness, difficulty swallowing , nasal congestion. CV: Negative for chest pain, angina, palpitations, dyspnea on exertion, peripheral edema.  Respiratory: Negative for dyspnea at rest, dyspnea on exertion, cough, sputum, wheezing.  GI: See history of present illness. GU:  Negative for dysuria, hematuria, urinary incontinence, urinary frequency,  nocturnal urination.  MS: Negative for joint pain, low back pain.  Derm: Negative for rash or itching.  Neuro: Negative for weakness, abnormal sensation, seizure, frequent headaches, memory loss, confusion.  Psych: Negative for anxiety, depression, suicidal ideation, hallucinations.  Endo: Negative for unusual weight change.  Heme: Negative for bruising or bleeding. Allergy: Negative for rash or hives.    Physical Examination:  BP 128/71 mmHg  Pulse 69  Temp(Src) 97.6 F (36.4 C) (Oral)  Ht 5\' 10"  (1.778 m)  Wt 183 lb (83.008 kg)  BMI 26.26 kg/m2   General: Well-nourished, well-developed in no acute distress.  Head: Normocephalic, atraumatic.   Eyes: Conjunctiva pink, no icterus. Mouth: Oropharyngeal mucosa moist and pink , no lesions erythema or exudate. Neck: Supple without thyromegaly, masses, or lymphadenopathy.  Lungs: Clear to auscultation bilaterally.  Heart: Regular rate and rhythm, no murmurs rubs or gallops.  Abdomen: Bowel sounds are normal, nontender, nondistended, no hepatosplenomegaly or masses, no abdominal bruits or    hernia , no rebound or guarding.   Rectal: Not performed Extremities: No lower extremity edema. No clubbing or deformities.  Neuro: Alert and oriented x 4 , grossly normal neurologically.  Skin: Warm and dry, no rash or jaundice.   Psych: Alert and cooperative, normal mood and affect.  Labs: None available  Imaging Studies: No results found.

## 2016-03-02 ENCOUNTER — Ambulatory Visit (HOSPITAL_COMMUNITY)
Admission: RE | Admit: 2016-03-02 | Discharge: 2016-03-02 | Disposition: A | Discharge status: Home / Self Care | Payer: Medicare Other | Source: Ambulatory Visit | Attending: Internal Medicine | Admitting: Internal Medicine

## 2016-03-02 ENCOUNTER — Encounter (HOSPITAL_COMMUNITY)
Admission: RE | Disposition: A | Discharge status: Home / Self Care | Payer: Self-pay | Source: Ambulatory Visit | Attending: Internal Medicine

## 2016-03-02 ENCOUNTER — Encounter (HOSPITAL_COMMUNITY): Payer: Self-pay | Admitting: *Deleted

## 2016-03-02 DIAGNOSIS — M1990 Unspecified osteoarthritis, unspecified site: Secondary | ICD-10-CM | POA: Diagnosis not present

## 2016-03-02 DIAGNOSIS — K449 Diaphragmatic hernia without obstruction or gangrene: Secondary | ICD-10-CM | POA: Insufficient documentation

## 2016-03-02 DIAGNOSIS — K219 Gastro-esophageal reflux disease without esophagitis: Secondary | ICD-10-CM | POA: Diagnosis not present

## 2016-03-02 DIAGNOSIS — I10 Essential (primary) hypertension: Secondary | ICD-10-CM | POA: Insufficient documentation

## 2016-03-02 DIAGNOSIS — Z808 Family history of malignant neoplasm of other organs or systems: Secondary | ICD-10-CM | POA: Insufficient documentation

## 2016-03-02 DIAGNOSIS — I2510 Atherosclerotic heart disease of native coronary artery without angina pectoris: Secondary | ICD-10-CM | POA: Diagnosis not present

## 2016-03-02 DIAGNOSIS — Z1289 Encounter for screening for malignant neoplasm of other sites: Secondary | ICD-10-CM | POA: Diagnosis not present

## 2016-03-02 DIAGNOSIS — K317 Polyp of stomach and duodenum: Secondary | ICD-10-CM | POA: Diagnosis not present

## 2016-03-02 DIAGNOSIS — Z7982 Long term (current) use of aspirin: Secondary | ICD-10-CM | POA: Diagnosis not present

## 2016-03-02 DIAGNOSIS — Z87891 Personal history of nicotine dependence: Secondary | ICD-10-CM | POA: Diagnosis not present

## 2016-03-02 DIAGNOSIS — Z79899 Other long term (current) drug therapy: Secondary | ICD-10-CM | POA: Insufficient documentation

## 2016-03-02 DIAGNOSIS — Z96653 Presence of artificial knee joint, bilateral: Secondary | ICD-10-CM | POA: Insufficient documentation

## 2016-03-02 DIAGNOSIS — K2270 Barrett's esophagus without dysplasia: Secondary | ICD-10-CM | POA: Diagnosis not present

## 2016-03-02 HISTORY — PX: ESOPHAGOGASTRODUODENOSCOPY: SHX5428

## 2016-03-02 SURGERY — EGD (ESOPHAGOGASTRODUODENOSCOPY)
Anesthesia: Moderate Sedation

## 2016-03-02 MED ORDER — SODIUM CHLORIDE 0.9 % IV SOLN
INTRAVENOUS | Status: DC
  Administered 2016-03-02 (×2): 1000 mL via INTRAVENOUS

## 2016-03-02 MED ORDER — SIMETHICONE 40 MG/0.6ML PO SUSP
ORAL | Status: AC
  Filled 2016-03-02: qty 30

## 2016-03-02 MED ORDER — LIDOCAINE VISCOUS 2 % MT SOLN
OROMUCOSAL | Status: DC | PRN
  Administered 2016-03-02: 1 via OROMUCOSAL

## 2016-03-02 MED ORDER — MEPERIDINE HCL 100 MG/ML IJ SOLN
INTRAMUSCULAR | Status: AC
  Filled 2016-03-02: qty 2

## 2016-03-02 MED ORDER — PROMETHAZINE HCL 25 MG/ML IJ SOLN
INTRAMUSCULAR | Status: AC
  Filled 2016-03-02: qty 1

## 2016-03-02 MED ORDER — MIDAZOLAM HCL 5 MG/5ML IJ SOLN
INTRAMUSCULAR | Status: DC | PRN
  Administered 2016-03-02: 2 mg via INTRAVENOUS

## 2016-03-02 MED ORDER — MEPERIDINE HCL 100 MG/ML IJ SOLN
INTRAMUSCULAR | Status: DC | PRN
  Administered 2016-03-02: 50 mg via INTRAVENOUS

## 2016-03-02 MED ORDER — MIDAZOLAM HCL 5 MG/5ML IJ SOLN
INTRAMUSCULAR | Status: AC
  Filled 2016-03-02: qty 10

## 2016-03-02 MED ORDER — STERILE WATER FOR IRRIGATION IR SOLN
Status: DC | PRN
  Administered 2016-03-02: 2.5 mL

## 2016-03-02 MED ORDER — LIDOCAINE VISCOUS 2 % MT SOLN
OROMUCOSAL | Status: AC
  Filled 2016-03-02: qty 15

## 2016-03-02 MED ORDER — PROMETHAZINE HCL 25 MG/ML IJ SOLN
12.5000 mg | Freq: Once | INTRAMUSCULAR | Status: AC
  Administered 2016-03-02: 12.5 mg via INTRAVENOUS

## 2016-03-02 MED ORDER — SODIUM CHLORIDE 0.9% FLUSH
INTRAVENOUS | Status: AC
  Filled 2016-03-02: qty 10

## 2016-03-02 MED ORDER — ONDANSETRON HCL 4 MG/2ML IJ SOLN
INTRAMUSCULAR | Status: DC | PRN
  Administered 2016-03-02: 4 mg via INTRAVENOUS

## 2016-03-02 MED ORDER — ONDANSETRON HCL 4 MG/2ML IJ SOLN
INTRAMUSCULAR | Status: AC
  Filled 2016-03-02: qty 2

## 2016-03-02 NOTE — Op Note (Signed)
[facility] Patient Name: Todd Nelson Procedure Date: 03/02/2016 7:25 AM MRN: [id number] Date of Birth: [birth year] Attending MD: Norvel Richards , MD CSN: [id number] Age: 75 Admit Type: Outpatient Procedure:                Upper GI endoscopy with esophageal and gastric                            biopsy Indications:              Surveillance for malignancy due to personal history                            of Barrett's esophagus Providers:                Norvel Richards, MD, Lurline Del, RN, Isabella Stalling, Technician Referring MD:              Medicines:                Midazolam 2 mg IV, Meperidine 50 mg IV, Ondansetron                            4 mg IV, Promethazine [id number] mg IV Complications:            No immediate complications. Estimated Blood Loss:     Estimated blood loss was minimal. Procedure:                Pre-Anesthesia Assessment:                           - ASA Grade Assessment: II - A patient with mild                            systemic disease.                           - After reviewing the risks and benefits, the                            patient was deemed in satisfactory condition to                            undergo the procedure.                           - Patient identification and proposed procedure                            were verified prior to the procedure [Verifying                            Personnel]. [Verification].                           - Prior to the procedure, a History and Physical  was performed, and patient medications and                            allergies were reviewed. The patient's tolerance of                            previous anesthesia was also reviewed. The risks                            and benefits of the procedure and the sedation                            options and risks were discussed with the patient.                            All questions were  answered, and informed consent                            was obtained. Prior Anticoagulants: The patient has                            taken no previous anticoagulant or antiplatelet                            agents. ASA Grade Assessment: II - A patient with                            mild systemic disease. After reviewing the risks                            and benefits, the patient was deemed in                            satisfactory condition to undergo the procedure.                           After obtaining informed consent, the endoscope was                            passed under direct vision. Throughout the                            procedure, the patient's blood pressure, pulse, and                            oxygen saturations were monitored continuously.The                            upper GI endoscopy was accomplished without                            difficulty. The patient tolerated the procedure  well. The [id number]) was introduced through                            the and advanced to the second part of duodenum. Scope In: 7:39:30 AM Scope Out: 7:46:23 AM Total Procedure Duration: 0 hours 6 minutes 53 seconds  Findings:      Mucosal changes were found at the gastroesophageal junction. Accentuated       undulating Z line; versus short segment Barrett's epithelium. No       esophagitis. No tumor..Biopsies were taken with a cold forceps for       histology. Tubular esophagus patent throughout its course.      Multiple 4 mm pedunculated and sessile polyps were found in the stomach.       This was biopsied with a cold forceps for histology. Estimated blood       loss was minimal. Small hiatal hernia. patchy antral erythema. No ulcer       or infiltrating process.      The second portion of the duodenum was normal. Biopsies were taken with       a cold forceps for histology. Estimated blood loss was minimal. Impression:               -  Mucosal changes in the esophagus. Biopsied.                           - Multiple gastric polyps. Biopsied. Hiatal hernia.                            Gastric erythema                           - Normal second portion of the duodenum. Biopsied. Moderate Sedation:      Moderate (conscious) sedation was administered by the endoscopy nurse       and supervised by the endoscopist. The following parameters were       monitored: oxygen saturation, heart rate, blood pressure, respiratory       rate, EKG, adequacy of pulmonary ventilation, and response to care.       Total physician intraservice time was 13 minutes. Recommendation:           - Patient has a contact number available for                            emergencies. The signs and symptoms of potential                            delayed complications were discussed with the                            patient. Return to normal activities tomorrow.                            Written discharge instructions were provided to the                            patient.                           -  Advance diet as tolerated.                           - Continue present medications.                           - Await pathology results.                           - Repeat upper endoscopy after studies are complete                            for surveillance based on pathology results.                           - Return to GI clinic (date not yet determined). Procedure Code(s):        --- Professional ---                           [phone], Moderate sedation services provided by the                            same physician or other qualified health care                            professional performing the diagnostic or                            therapeutic service that the sedation supports,                            requiring the presence of an independent trained                            observer to assist in the monitoring of the                             patient's level of consciousness and physiological                            status; initial 15 minutes of intraservice time,                            patient age 37 years or older Diagnosis Code(s):        --- Professional ---                           K22.8, Other specified diseases of esophagus                           K31.7, Polyp of stomach and duodenum                           K22.70, Barrett's esophagus without dysplasia CPT copyright 2016 American [employer] Association. All rights reserved. The codes documented in  this report are preliminary and upon coder review may  be revised to meet current compliance requirements. Cristopher Estimable. Markeise Mathews, MD Norvel Richards, MD 03/02/2016 8:10:09 AM This report has been signed electronically. Number of Addenda: 0

## 2016-03-02 NOTE — Interval H&P Note (Signed)
History and Physical Interval Note:  03/02/2016 7:28 AM  Todd Nelson  has presented today for surgery, with the diagnosis of GERD, Barretts esophagus  The various methods of treatment have been discussed with the patient and family. After consideration of risks, benefits and other options for treatment, the patient has consented to  Procedure(s) with comments: ESOPHAGOGASTRODUODENOSCOPY (EGD) (N/A) - 0730 as a surgical intervention .  The patient's history has been reviewed, patient examined, no change in status, stable for surgery.  I have reviewed the patient's chart and labs.  Questions were answered to the patient's satisfaction.     Iain Sawchuk  No change; no dysphagia; EGD per plan. The risks, benefits, limitations, alternatives and imponderables have been reviewed with the patient. Potential for esophageal dilation, biopsy, etc. have also been reviewed.  Questions have been answered. All parties agreeable.

## 2016-03-02 NOTE — H&P (View-Only) (Signed)
Primary Care Physician:  Glenda Chroman, MD  Primary Gastroenterologist:  Garfield Cornea, MD   Chief Complaint  Patient presents with  . EGD    HPI:  Todd Nelson is a 75 y.o. male here to schedule surveillance EGD given history of Barrett's esophagus.Last EGD in 2013. No evidence of dysplasia. He also was treated for H. pylori gastritis at that time. Patient clinically is doing well. He continues omeprazole 20 mg daily. Heartburn well-controlled. No dysphagia since EGD dilation 2013. No vomiting, abdominal pain, constipation, diarrhea, melena, rectal bleeding.  Current Outpatient Prescriptions  Medication Sig Dispense Refill  . amLODipine (NORVASC) 10 MG tablet Take 10 mg by mouth daily.      Marland Kitchen aspirin 81 MG tablet Take 81 mg by mouth daily.      . fexofenadine (ALLEGRA) 180 MG tablet Take 180 mg by mouth daily as needed for allergies.     . fish oil-omega-3 fatty acids 1000 MG capsule Take 1 g by mouth 2 (two) times daily.     . meloxicam (MOBIC) 7.5 MG tablet Take 7.5 mg by mouth 2 (two) times daily.     . metoprolol succinate (TOPROL-XL) 25 MG 24 hr tablet Take 25 mg by mouth daily.    Marland Kitchen omeprazole (PRILOSEC) 20 MG capsule Take 20 mg by mouth daily.      . simvastatin (ZOCOR) 20 MG tablet Take 20 mg by mouth at bedtime.      . valsartan-hydrochlorothiazide (DIOVAN-HCT) 80-12.5 MG per tablet Take 1 tablet by mouth daily.       No current facility-administered medications for this visit.    Allergies as of 02/24/2016  . (No Known Allergies)    Past Medical History  Diagnosis Date  . Arthritis   . Barrett esophagus 5/09    first dx on EGD   . Schatzki's ring 5/09    s/p dilation  . Umbilical hernia   . CAD (coronary artery disease)   . Hyperchloremia   . HTN (hypertension)   . GERD (gastroesophageal reflux disease)   . Alcohol use ([place])   . Personal history of tobacco use, presenting hazards to health   . Hemorrhoids, external   . Osteoarthritis     Past Surgical  History  Procedure Laterality Date  . Colonoscopy  2005    hyperplastic polyps,hemorrhoids  . Appendectomy    . Tonsillectomy    . Neck surgery  2008    plate  . Esophagogastroduodenoscopy  02/06/08    RMR: short-segment Barrett's, Schatzki's ring s/p dilation   . Knee surgery  2011    both knees-Bil knee replacement  . Esophagogastroduodenoscopy  July 2010    RMR: short-segment Barrett's  . Esophagogastroduodenoscopy (egd) with esophageal dilation  08/21/2012    RMR: noncritical Schatzki ring, small hh, short-segment Barretts with no dysplasia.H.pylori gastritis  . Colonoscopy N/A 02/04/2014    Rourk: normal. consider one more screening in 10 years    Family History  Problem Relation Age of Onset  . Pancreatic cancer Father 75    Social History   Social History  . Marital Status: Married    Spouse Name: N/A  . Number of Children: N/A  . Years of Education: N/A   Occupational History  . retired     from [employer]  . part-time     [employer]    Social History Main Topics  . Smoking status: Former Smoker -- 1.00 packs/day for 20 years    Types: Cigarettes  Start date: 07/24/1954    Quit date: 09/27/1984  . Smokeless tobacco: Never Used  . Alcohol Use: 0.0 oz/week    0 Standard drinks or equivalent per week     Comment: 3 beers a day  . Drug Use: No  . Sexual Activity: Yes    Birth Control/ Protection: None   Other Topics Concern  . Not on file   Social History Narrative      ROS:  General: Negative for anorexia, weight loss, fever, chills, fatigue, weakness. Eyes: Negative for vision changes.  ENT: Negative for hoarseness, difficulty swallowing , nasal congestion. CV: Negative for chest pain, angina, palpitations, dyspnea on exertion, peripheral edema.  Respiratory: Negative for dyspnea at rest, dyspnea on exertion, cough, sputum, wheezing.  GI: See history of present illness. GU:  Negative for dysuria, hematuria, urinary incontinence, urinary frequency,  nocturnal urination.  MS: Negative for joint pain, low back pain.  Derm: Negative for rash or itching.  Neuro: Negative for weakness, abnormal sensation, seizure, frequent headaches, memory loss, confusion.  Psych: Negative for anxiety, depression, suicidal ideation, hallucinations.  Endo: Negative for unusual weight change.  Heme: Negative for bruising or bleeding. Allergy: Negative for rash or hives.    Physical Examination:  BP 128/71 mmHg  Pulse 69  Temp(Src) 97.6 F (36.4 C) (Oral)  Ht 5\' 10"  (1.778 m)  Wt 183 lb (83.008 kg)  BMI 26.26 kg/m2   General: Well-nourished, well-developed in no acute distress.  Head: Normocephalic, atraumatic.   Eyes: Conjunctiva pink, no icterus. Mouth: Oropharyngeal mucosa moist and pink , no lesions erythema or exudate. Neck: Supple without thyromegaly, masses, or lymphadenopathy.  Lungs: Clear to auscultation bilaterally.  Heart: Regular rate and rhythm, no murmurs rubs or gallops.  Abdomen: Bowel sounds are normal, nontender, nondistended, no hepatosplenomegaly or masses, no abdominal bruits or    hernia , no rebound or guarding.   Rectal: Not performed Extremities: No lower extremity edema. No clubbing or deformities.  Neuro: Alert and oriented x 4 , grossly normal neurologically.  Skin: Warm and dry, no rash or jaundice.   Psych: Alert and cooperative, normal mood and affect.  Labs: None available  Imaging Studies: No results found.

## 2016-03-02 NOTE — Discharge Instructions (Signed)
EGD Discharge instructions Please read the instructions outlined below and refer to this sheet in the next few weeks. These discharge instructions provide you with general information on caring for yourself after you leave the hospital. Your doctor may also give you specific instructions. While your treatment has been planned according to the most current medical practices available, unavoidable complications occasionally occur. If you have any problems or questions after discharge, please call your doctor.  Dr Gala [address] may resume your regular activity but move at a slower pace for the next 24 hours.   Take frequent rest periods for the next 24 hours.   Walking will help expel (get rid of) the air and reduce the bloated feeling in your abdomen.   No driving for 24 hours (because of the anesthesia (medicine) used during the test).   You may shower.   Do not sign any important legal documents or operate any machinery for 24 hours (because of the anesthesia used during the test).  NUTRITION  Drink plenty of fluids.   You may resume your normal diet.   Begin with a light meal and progress to your normal diet.   Avoid alcoholic beverages for 24 hours or as instructed by your caregiver.  MEDICATIONS  You may resume your normal medications unless your caregiver tells you otherwise.  WHAT YOU CAN EXPECT TODAY  You may experience abdominal discomfort such as a feeling of fullness or gas pains.  FOLLOW-UP  Your doctor will discuss the results of your test with you.  SEEK IMMEDIATE MEDICAL ATTENTION IF ANY OF THE FOLLOWING OCCUR:  Excessive nausea (feeling sick to your stomach) and/or vomiting.   Severe abdominal pain and distention (swelling).   Trouble swallowing.   Temperature over 101 F (37.8 C).   Rectal bleeding or vomiting of blood.     GERD information provided   Continue omeprazole daily  Further recommendations to follow pending review of  pathology report   Gastroesophageal Reflux Disease, Adult Normally, food travels down the esophagus and stays in the stomach to be digested. However, when a person has gastroesophageal reflux disease (GERD), food and stomach acid move back up into the esophagus. When this happens, the esophagus becomes sore and inflamed. Over time, GERD can create small holes (ulcers) in the lining of the esophagus.  CAUSES This condition is caused by a problem with the muscle between the esophagus and the stomach (lower esophageal sphincter, or LES). Normally, the LES muscle closes after food passes through the esophagus to the stomach. When the LES is weakened or abnormal, it does not close properly, and that allows food and stomach acid to go back up into the esophagus. The LES can be weakened by certain dietary substances, medicines, and medical conditions, including:  Tobacco use.  Pregnancy.  Having a hiatal hernia.  Heavy alcohol use.  Certain foods and beverages, such as coffee, chocolate, onions, and peppermint. RISK FACTORS This condition is more likely to develop in:  People who have an increased body weight.  People who have connective tissue disorders.  People who use NSAID medicines. SYMPTOMS Symptoms of this condition include:  Heartburn.  Difficult or painful swallowing.  The feeling of having a lump in the throat.  Abitter taste in the mouth.  Bad breath.  Having a large amount of saliva.  Having an upset or bloated stomach.  Belching.  Chest pain.  Shortness of breath or wheezing.  Ongoing (chronic) cough or a night-time cough.  Wearing  away of tooth enamel.  Weight loss. Different conditions can cause chest pain. Make sure to see your health care provider if you experience chest pain. DIAGNOSIS Your health care provider will take a medical history and perform a physical exam. To determine if you have mild or severe GERD, your health care provider may also  monitor how you respond to treatment. You may also have other tests, including:  An endoscopy toexamine your stomach and esophagus with a small camera.  A test thatmeasures the acidity level in your esophagus.  A test thatmeasures how much pressure is on your esophagus.  A barium swallow or modified barium swallow to show the shape, size, and functioning of your esophagus. TREATMENT The goal of treatment is to help relieve your symptoms and to prevent complications. Treatment for this condition may vary depending on how severe your symptoms are. Your health care provider may recommend:  Changes to your diet.  Medicine.  Surgery. HOME CARE INSTRUCTIONS Diet  Follow a diet as recommended by your health care provider. This may involve avoiding foods and drinks such as:  Coffee and tea (with or without caffeine).  Drinks that containalcohol.  Energy drinks and sports drinks.  Carbonated drinks or sodas.  Chocolate and cocoa.  Peppermint and mint flavorings.  Garlic and onions.  Horseradish.  Spicy and acidic foods, including peppers, chili powder, curry powder, vinegar, hot sauces, and barbecue sauce.  Citrus fruit juices and citrus fruits, such as oranges, lemons, and limes.  Tomato-based foods, such as red sauce, chili, salsa, and pizza with red sauce.  Fried and fatty foods, such as donuts, french fries, potato chips, and high-fat dressings.  High-fat meats, such as hot dogs and fatty cuts of red and white meats, such as rib eye steak, sausage, ham, and bacon.  High-fat dairy items, such as whole milk, butter, and cream cheese.  Eat small, frequent meals instead of large meals.  Avoid drinking large amounts of liquid with your meals.  Avoid eating meals during the 2-3 hours before bedtime.  Avoid lying down right after you eat.  Do not exercise right after you eat. General Instructions  Pay attention to any changes in your symptoms.  Take  over-the-counter and prescription medicines only as told by your health care provider. Do not take aspirin, ibuprofen, or other NSAIDs unless your health care provider told you to do so.  Do not use any tobacco products, including cigarettes, chewing tobacco, and e-cigarettes. If you need help quitting, ask your health care provider.  Wear loose-fitting clothing. Do not wear anything tight around your waist that causes pressure on your abdomen.  Raise (elevate) the head of your bed 6 inches (15cm).  Try to reduce your stress, such as with yoga or meditation. If you need help reducing stress, ask your health care provider.  If you are overweight, reduce your weight to an amount that is healthy for you. Ask your health care provider for guidance about a safe weight loss goal.  Keep all follow-up visits as told by your health care provider. This is important. SEEK MEDICAL CARE IF:  You have new symptoms.  You have unexplained weight loss.  You have difficulty swallowing, or it hurts to swallow.  You have wheezing or a persistent cough.  Your symptoms do not improve with treatment.  You have a hoarse voice. SEEK IMMEDIATE MEDICAL CARE IF:  You have pain in your arms, neck, jaw, teeth, or back.  You feel sweaty, dizzy, or light-headed.  You have chest pain or shortness of breath.  You vomit and your vomit looks like blood or coffee grounds.  You faint.  Your stool is bloody or black.  You cannot swallow, drink, or eat.   This information is not intended to replace advice given to you by your health care provider. Make sure you discuss any questions you have with your health care provider.   Document Released: 06/23/2005 Document Revised: 06/04/2015 Document Reviewed: 01/08/2015 Elsevier Interactive Patient Education [employer].

## 2016-03-09 ENCOUNTER — Encounter (HOSPITAL_COMMUNITY): Payer: Self-pay | Admitting: Internal Medicine

## 2016-03-09 ENCOUNTER — Encounter: Payer: Self-pay | Admitting: Internal Medicine

## 2016-03-09 ENCOUNTER — Telehealth: Payer: Self-pay

## 2016-03-09 DIAGNOSIS — M4712 Other spondylosis with myelopathy, cervical region: Secondary | ICD-10-CM | POA: Diagnosis not present

## 2016-03-09 NOTE — Telephone Encounter (Signed)
Letter mailed to the pt. 

## 2016-03-09 NOTE — Telephone Encounter (Signed)
Per RMR-  Send letter to patient.  Send copy of letter with path to referring provider and PCP.   Office visit in one year

## 2016-03-10 NOTE — Telephone Encounter (Signed)
ON RECALL  °

## 2016-03-23 DIAGNOSIS — I10 Essential (primary) hypertension: Secondary | ICD-10-CM | POA: Diagnosis not present

## 2016-03-23 DIAGNOSIS — E7800 Pure hypercholesterolemia, unspecified: Secondary | ICD-10-CM | POA: Diagnosis not present

## 2016-03-31 ENCOUNTER — Ambulatory Visit (INDEPENDENT_AMBULATORY_CARE_PROVIDER_SITE_OTHER): Payer: Medicare Other | Admitting: Cardiovascular Disease

## 2016-03-31 ENCOUNTER — Encounter: Payer: Self-pay | Admitting: Cardiovascular Disease

## 2016-03-31 VITALS — BP 103/70 | HR 73 | Ht 70.0 in | Wt 165.0 lb

## 2016-03-31 DIAGNOSIS — R002 Palpitations: Secondary | ICD-10-CM | POA: Diagnosis not present

## 2016-03-31 DIAGNOSIS — I2510 Atherosclerotic heart disease of native coronary artery without angina pectoris: Secondary | ICD-10-CM

## 2016-03-31 DIAGNOSIS — I10 Essential (primary) hypertension: Secondary | ICD-10-CM

## 2016-03-31 NOTE — Progress Notes (Signed)
Patient ID: Todd Nelson, male   DOB: Feb 12, 1941, 75 y.o.   MRN: [id number]      SUBJECTIVE: The patient presents for follow-up of palpitations, coronary artery disease, and hypertension. He has a history of single-vessel coronary artery disease with a 40% left main lesion. He has no other significant coronary artery disease. He also has HTN and a former h/o tobacco abuse. He had an echocardiogram performed on 07/01/2014 at an outside facility which reportedly demonstrated normal left ventricular systolic function, EF 123456, mild LVH, diastolic dysfunction, aortic valve sclerosis without stenosis, moderate mitral regurgitation, and mild tricuspid and pulmonic regurgitation.  He has been doing well and denies chest pain and palpitations. Seldom has shortness of breath. No leg swelling.  ECG performed in the office today which I personally reviewed demonstrates normal sinus rhythm with no ischemic ST segment or T-wave abnormalities, nor any arrhythmias.  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 Lifts no more than 50 lbs.   Review of Systems: As per "subjective", otherwise negative.  No Known Allergies  Current Outpatient Prescriptions  Medication Sig Dispense Refill  . amLODipine (NORVASC) 10 MG tablet Take 10 mg by mouth daily.      Marland Kitchen aspirin 81 MG tablet Take 81 mg by mouth daily.      . fexofenadine (ALLEGRA) 180 MG tablet Take 180 mg by mouth daily as needed for allergies.     . fish oil-omega-3 fatty acids 1000 MG capsule Take 1 g by mouth 2 (two) times daily.     . meloxicam (MOBIC) 7.5 MG tablet Take 7.5 mg by mouth 2 (two) times daily.     . metoprolol succinate (TOPROL-XL) 25 MG 24 hr tablet Take 25 mg by mouth daily.    Marland Kitchen omeprazole (PRILOSEC) 20 MG capsule Take 20 mg by mouth daily.      . simvastatin (ZOCOR) 20 MG tablet Take 20 mg by mouth at bedtime.      . valsartan-hydrochlorothiazide (DIOVAN-HCT) 80-12.5 MG per tablet Take 1 tablet by mouth daily.       No current  facility-administered medications for this visit.    Past Medical History  Diagnosis Date  . Arthritis   . Barrett esophagus 5/09    first dx on EGD   . Schatzki's ring 5/09    s/p dilation  . Umbilical hernia   . CAD (coronary artery disease)   . Hyperchloremia   . HTN (hypertension)   . GERD (gastroesophageal reflux disease)   . Alcohol use ([place])   . Personal history of tobacco use, presenting hazards to health   . Hemorrhoids, external   . Osteoarthritis     Past Surgical History  Procedure Laterality Date  . Colonoscopy  2005    hyperplastic polyps,hemorrhoids  . Appendectomy    . Tonsillectomy    . Neck surgery  2008    plate  . Esophagogastroduodenoscopy  02/06/08    RMR: short-segment Barrett's, Schatzki's ring s/p dilation   . Knee surgery  2011    both knees-Bil knee replacement  . Esophagogastroduodenoscopy  July 2010    RMR: short-segment Barrett's  . Esophagogastroduodenoscopy (egd) with esophageal dilation  08/21/2012    RMR: noncritical Schatzki ring, small hh, short-segment Barretts with no dysplasia.H.pylori gastritis  . Colonoscopy N/A 02/04/2014    Rourk: normal. consider one more screening in 10 years  . Esophagogastroduodenoscopy N/A 03/02/2016    Procedure: ESOPHAGOGASTRODUODENOSCOPY (EGD);  Surgeon: Daneil Dolin, MD;  Location: [facility];  Service:  Endoscopy;  Laterality: N/A;  0730    Social History   Social History  . Marital Status: Married    Spouse Name: N/A  . Number of Children: N/A  . Years of Education: N/A   Occupational History  . retired     from [employer]  . part-time     [employer]    Social History Main Topics  . Smoking status: Former Smoker -- 1.00 packs/day for 20 years    Types: Cigarettes    Start date: 07/24/1954    Quit date: 09/27/1984  . Smokeless tobacco: Never Used  . Alcohol Use: 0.0 oz/week    0 Standard drinks or equivalent per week     Comment: 3 beers a day  . Drug Use: No  . Sexual Activity: Yes     Birth Control/ Protection: None   Other Topics Concern  . Not on file   Social History Narrative     Filed Vitals:   03/31/16 0851  BP: 103/70  Pulse: 73  Height: 5\' 10"  (1.778 m)  Weight: 165 lb (74.844 kg)  SpO2: 95%    PHYSICAL EXAM General: NAD HEENT: Normal. Neck: No JVD, no thyromegaly. Lungs: Clear to auscultation bilaterally with normal respiratory effort. CV: Nondisplaced PMI.  Regular rate and rhythm, normal S1/S2, no S3/S4, no murmur. No pretibial or periankle edema.  No carotid bruit.   Abdomen: Soft, nontender, no distention.  Neurologic: Alert and oriented.  Psych: Normal affect. Skin: Normal. Musculoskeletal: No gross deformities.    ECG: Most recent ECG reviewed.      ASSESSMENT AND PLAN: 1. Palpitations: Symptomatically stable on Toprol-XL 25 mg daily. No changes.  2. CAD: Stable ischemic heart disease. On ASA, statin, and beta blocker. No changes to therapy indicated.  3. Essential HTN: Well controlled on current therapy which includes amlodipine and valsartan-HCTZ. No changes are required.  Dispo: f/u 1 year.   Kate Sable, M.D., F.A.C.C.

## 2016-03-31 NOTE — Patient Instructions (Signed)
Your physician wants you to follow-up in: 1 YEAR WITH DR KONESWARAN You will receive a reminder letter in the mail two months in advance. If you don't receive a letter, please call our office to schedule the follow-up appointment.  Your physician recommends that you continue on your current medications as directed. Please refer to the Current Medication list given to you today.  Thank you for choosing [facility]!!    

## 2016-06-03 DIAGNOSIS — I471 Supraventricular tachycardia: Secondary | ICD-10-CM | POA: Diagnosis not present

## 2016-06-03 DIAGNOSIS — I10 Essential (primary) hypertension: Secondary | ICD-10-CM | POA: Diagnosis not present

## 2016-06-03 DIAGNOSIS — L723 Sebaceous cyst: Secondary | ICD-10-CM | POA: Diagnosis not present

## 2016-06-03 DIAGNOSIS — L089 Local infection of the skin and subcutaneous tissue, unspecified: Secondary | ICD-10-CM | POA: Diagnosis not present

## 2016-06-07 DIAGNOSIS — L720 Epidermal cyst: Secondary | ICD-10-CM | POA: Diagnosis not present

## 2016-06-10 DIAGNOSIS — Z79899 Other long term (current) drug therapy: Secondary | ICD-10-CM | POA: Diagnosis not present

## 2016-06-10 DIAGNOSIS — L720 Epidermal cyst: Secondary | ICD-10-CM | POA: Diagnosis not present

## 2016-06-10 DIAGNOSIS — M810 Age-related osteoporosis without current pathological fracture: Secondary | ICD-10-CM | POA: Diagnosis not present

## 2016-06-10 DIAGNOSIS — L723 Sebaceous cyst: Secondary | ICD-10-CM | POA: Diagnosis not present

## 2016-06-10 DIAGNOSIS — Z7982 Long term (current) use of aspirin: Secondary | ICD-10-CM | POA: Diagnosis not present

## 2016-06-10 DIAGNOSIS — I10 Essential (primary) hypertension: Secondary | ICD-10-CM | POA: Diagnosis not present

## 2016-06-10 DIAGNOSIS — Z803 Family history of malignant neoplasm of breast: Secondary | ICD-10-CM | POA: Diagnosis not present

## 2016-06-10 DIAGNOSIS — Z808 Family history of malignant neoplasm of other organs or systems: Secondary | ICD-10-CM | POA: Diagnosis not present

## 2016-06-10 DIAGNOSIS — Z96653 Presence of artificial knee joint, bilateral: Secondary | ICD-10-CM | POA: Diagnosis not present

## 2016-06-10 DIAGNOSIS — Z981 Arthrodesis status: Secondary | ICD-10-CM | POA: Diagnosis not present

## 2016-06-10 DIAGNOSIS — Z823 Family history of stroke: Secondary | ICD-10-CM | POA: Diagnosis not present

## 2016-06-10 DIAGNOSIS — Z8249 Family history of ischemic heart disease and other diseases of the circulatory system: Secondary | ICD-10-CM | POA: Diagnosis not present

## 2016-06-10 DIAGNOSIS — K219 Gastro-esophageal reflux disease without esophagitis: Secondary | ICD-10-CM | POA: Diagnosis not present

## 2016-06-10 DIAGNOSIS — Z791 Long term (current) use of non-steroidal anti-inflammatories (NSAID): Secondary | ICD-10-CM | POA: Diagnosis not present

## 2016-06-15 DIAGNOSIS — Z808 Family history of malignant neoplasm of other organs or systems: Secondary | ICD-10-CM | POA: Diagnosis not present

## 2016-06-15 DIAGNOSIS — Z981 Arthrodesis status: Secondary | ICD-10-CM | POA: Diagnosis not present

## 2016-06-15 DIAGNOSIS — Z791 Long term (current) use of non-steroidal anti-inflammatories (NSAID): Secondary | ICD-10-CM | POA: Diagnosis not present

## 2016-06-15 DIAGNOSIS — L989 Disorder of the skin and subcutaneous tissue, unspecified: Secondary | ICD-10-CM | POA: Diagnosis not present

## 2016-06-15 DIAGNOSIS — Z96653 Presence of artificial knee joint, bilateral: Secondary | ICD-10-CM | POA: Diagnosis not present

## 2016-06-15 DIAGNOSIS — Z803 Family history of malignant neoplasm of breast: Secondary | ICD-10-CM | POA: Diagnosis not present

## 2016-06-15 DIAGNOSIS — L720 Epidermal cyst: Secondary | ICD-10-CM | POA: Diagnosis not present

## 2016-06-15 DIAGNOSIS — Z7982 Long term (current) use of aspirin: Secondary | ICD-10-CM | POA: Diagnosis not present

## 2016-06-15 DIAGNOSIS — Z79899 Other long term (current) drug therapy: Secondary | ICD-10-CM | POA: Diagnosis not present

## 2016-06-15 DIAGNOSIS — L723 Sebaceous cyst: Secondary | ICD-10-CM | POA: Diagnosis not present

## 2016-06-15 DIAGNOSIS — K219 Gastro-esophageal reflux disease without esophagitis: Secondary | ICD-10-CM | POA: Diagnosis not present

## 2016-06-15 DIAGNOSIS — M810 Age-related osteoporosis without current pathological fracture: Secondary | ICD-10-CM | POA: Diagnosis not present

## 2016-06-15 DIAGNOSIS — I10 Essential (primary) hypertension: Secondary | ICD-10-CM | POA: Diagnosis not present

## 2016-07-20 DIAGNOSIS — E7800 Pure hypercholesterolemia, unspecified: Secondary | ICD-10-CM | POA: Diagnosis not present

## 2016-07-20 DIAGNOSIS — I10 Essential (primary) hypertension: Secondary | ICD-10-CM | POA: Diagnosis not present

## 2016-08-11 DIAGNOSIS — M4712 Other spondylosis with myelopathy, cervical region: Secondary | ICD-10-CM | POA: Diagnosis not present

## 2016-08-20 DIAGNOSIS — Z23 Encounter for immunization: Secondary | ICD-10-CM | POA: Diagnosis not present

## 2016-09-03 DIAGNOSIS — Z0000 Encounter for general adult medical examination without abnormal findings: Secondary | ICD-10-CM | POA: Diagnosis not present

## 2016-09-03 DIAGNOSIS — E7800 Pure hypercholesterolemia, unspecified: Secondary | ICD-10-CM | POA: Diagnosis not present

## 2016-09-03 DIAGNOSIS — Z1211 Encounter for screening for malignant neoplasm of colon: Secondary | ICD-10-CM | POA: Diagnosis not present

## 2016-09-03 DIAGNOSIS — Z7189 Other specified counseling: Secondary | ICD-10-CM | POA: Diagnosis not present

## 2016-09-03 DIAGNOSIS — Z79899 Other long term (current) drug therapy: Secondary | ICD-10-CM | POA: Diagnosis not present

## 2016-09-03 DIAGNOSIS — R5383 Other fatigue: Secondary | ICD-10-CM | POA: Diagnosis not present

## 2016-09-03 DIAGNOSIS — Z299 Encounter for prophylactic measures, unspecified: Secondary | ICD-10-CM | POA: Diagnosis not present

## 2016-09-03 DIAGNOSIS — Z125 Encounter for screening for malignant neoplasm of prostate: Secondary | ICD-10-CM | POA: Diagnosis not present

## 2016-09-03 DIAGNOSIS — Z1389 Encounter for screening for other disorder: Secondary | ICD-10-CM | POA: Diagnosis not present

## 2016-09-14 DIAGNOSIS — Z6826 Body mass index (BMI) 26.0-26.9, adult: Secondary | ICD-10-CM | POA: Diagnosis not present

## 2016-09-14 DIAGNOSIS — Z299 Encounter for prophylactic measures, unspecified: Secondary | ICD-10-CM | POA: Diagnosis not present

## 2016-09-14 DIAGNOSIS — Z713 Dietary counseling and surveillance: Secondary | ICD-10-CM | POA: Diagnosis not present

## 2016-09-14 DIAGNOSIS — R739 Hyperglycemia, unspecified: Secondary | ICD-10-CM | POA: Diagnosis not present

## 2017-01-06 DIAGNOSIS — Z6827 Body mass index (BMI) 27.0-27.9, adult: Secondary | ICD-10-CM | POA: Diagnosis not present

## 2017-01-06 DIAGNOSIS — I10 Essential (primary) hypertension: Secondary | ICD-10-CM | POA: Diagnosis not present

## 2017-01-06 DIAGNOSIS — Z299 Encounter for prophylactic measures, unspecified: Secondary | ICD-10-CM | POA: Diagnosis not present

## 2017-01-06 DIAGNOSIS — Z713 Dietary counseling and surveillance: Secondary | ICD-10-CM | POA: Diagnosis not present

## 2017-02-01 ENCOUNTER — Encounter: Payer: Self-pay | Admitting: Internal Medicine

## 2017-02-15 DIAGNOSIS — M4712 Other spondylosis with myelopathy, cervical region: Secondary | ICD-10-CM | POA: Diagnosis not present

## 2017-03-22 DIAGNOSIS — M9901 Segmental and somatic dysfunction of cervical region: Secondary | ICD-10-CM | POA: Diagnosis not present

## 2017-03-22 DIAGNOSIS — M47812 Spondylosis without myelopathy or radiculopathy, cervical region: Secondary | ICD-10-CM | POA: Diagnosis not present

## 2017-03-22 DIAGNOSIS — M531 Cervicobrachial syndrome: Secondary | ICD-10-CM | POA: Diagnosis not present

## 2017-03-24 DIAGNOSIS — M47812 Spondylosis without myelopathy or radiculopathy, cervical region: Secondary | ICD-10-CM | POA: Diagnosis not present

## 2017-03-24 DIAGNOSIS — M531 Cervicobrachial syndrome: Secondary | ICD-10-CM | POA: Diagnosis not present

## 2017-03-24 DIAGNOSIS — M9901 Segmental and somatic dysfunction of cervical region: Secondary | ICD-10-CM | POA: Diagnosis not present

## 2017-03-25 ENCOUNTER — Encounter: Payer: Self-pay | Admitting: Gastroenterology

## 2017-03-25 ENCOUNTER — Ambulatory Visit (INDEPENDENT_AMBULATORY_CARE_PROVIDER_SITE_OTHER): Payer: Medicare Other | Admitting: Gastroenterology

## 2017-03-25 VITALS — BP 123/69 | HR 70 | Temp 98.3°F | Ht 70.0 in | Wt 182.8 lb

## 2017-03-25 DIAGNOSIS — K2100 Gastro-esophageal reflux disease with esophagitis, without bleeding: Secondary | ICD-10-CM

## 2017-03-25 DIAGNOSIS — K2270 Barrett's esophagus without dysplasia: Secondary | ICD-10-CM

## 2017-03-25 NOTE — Patient Instructions (Signed)
1. Return to the office in May of 2020. We will schedule your follow up upper endoscopy at that time.  2. Continue omeprazole as before. Call with any questions or concerns.

## 2017-03-25 NOTE — Progress Notes (Signed)
cc'd to pcp 

## 2017-03-25 NOTE — Assessment & Plan Note (Signed)
Due for surveillance EGD June 2020. GERD is well managed on omeprazole daily. Continue current regimen. Patient come back in 2 years to schedule EGD at that time. He will call with any concerns in the interim.

## 2017-03-25 NOTE — Progress Notes (Signed)
      Primary Care Physician: Glenda Chroman, MD  Primary Gastroenterologist:  Garfield Cornea, MD   Chief Complaint  Patient presents with  . Barretts    HPI: Todd Nelson is a 76 y.o. male here for one-year follow-up. He has a history of Barrett's esophagus without dysplasia.EGD June 2017, Barrett's without dysplasia. Multiple gastric polyps, fundic gland. Plans for surveillance EGD in June 2020.  Clinically doing very well. No major medical concerns in the past year. Heartburn is well controlled. No dysphagia. No abdominal pain. No bowel concerns. No blood in the stool or melena.  Current Outpatient Prescriptions  Medication Sig Dispense Refill  . amLODipine (NORVASC) 10 MG tablet Take 10 mg by mouth daily.      Marland Kitchen aspirin 81 MG tablet Take 81 mg by mouth daily.      . fexofenadine (ALLEGRA) 180 MG tablet Take 180 mg by mouth daily as needed for allergies.     . fish oil-omega-3 fatty acids 1000 MG capsule Take 1 g by mouth 2 (two) times daily.     . meloxicam (MOBIC) 7.5 MG tablet Take 7.5 mg by mouth 2 (two) times daily.     . metoprolol succinate (TOPROL-XL) 25 MG 24 hr tablet Take 25 mg by mouth daily.    Marland Kitchen omeprazole (PRILOSEC) 20 MG capsule Take 20 mg by mouth daily.      . simvastatin (ZOCOR) 20 MG tablet Take 20 mg by mouth at bedtime.      . valsartan-hydrochlorothiazide (DIOVAN-HCT) 80-12.5 MG per tablet Take 1 tablet by mouth daily.       No current facility-administered medications for this visit.     Allergies as of 03/25/2017  . (No Known Allergies)    ROS:  General: Negative for anorexia, weight loss, fever, chills, fatigue, weakness. ENT: Negative for hoarseness, difficulty swallowing , nasal congestion. CV: Negative for chest pain, angina, palpitations, dyspnea on exertion, peripheral edema.  Respiratory: Negative for dyspnea at rest, dyspnea on exertion, cough, sputum, wheezing.  GI: See history of present illness. GU:  Negative for dysuria, hematuria,  urinary incontinence, urinary frequency, nocturnal urination.  Endo: Negative for unusual weight change.    Physical Examination:   BP 123/69   Pulse 70   Temp 98.3 F (36.8 C) (Oral)   Ht 5\' 10"  (1.778 m)   Wt 182 lb 12.8 oz (82.9 kg)   BMI 26.23 kg/m   General: Well-nourished, well-developed in no acute distress.  Eyes: No icterus. Mouth: Oropharyngeal mucosa moist and pink , no lesions erythema or exudate. Lungs: Clear to auscultation bilaterally.  Heart: Regular rate and rhythm, no murmurs rubs or gallops.  Abdomen: Bowel sounds are normal, nontender, nondistended, no hepatosplenomegaly or masses, no abdominal bruits or hernia , no rebound or guarding.   Extremities: No lower extremity edema. No clubbing or deformities. Neuro: Alert and oriented x 4   Skin: Warm and dry, no jaundice.   Psych: Alert and cooperative, normal mood and affect.

## 2017-03-28 DIAGNOSIS — M531 Cervicobrachial syndrome: Secondary | ICD-10-CM | POA: Diagnosis not present

## 2017-03-28 DIAGNOSIS — M9901 Segmental and somatic dysfunction of cervical region: Secondary | ICD-10-CM | POA: Diagnosis not present

## 2017-03-28 DIAGNOSIS — M47812 Spondylosis without myelopathy or radiculopathy, cervical region: Secondary | ICD-10-CM | POA: Diagnosis not present

## 2017-04-04 DIAGNOSIS — M531 Cervicobrachial syndrome: Secondary | ICD-10-CM | POA: Diagnosis not present

## 2017-04-04 DIAGNOSIS — M47812 Spondylosis without myelopathy or radiculopathy, cervical region: Secondary | ICD-10-CM | POA: Diagnosis not present

## 2017-04-04 DIAGNOSIS — M9901 Segmental and somatic dysfunction of cervical region: Secondary | ICD-10-CM | POA: Diagnosis not present

## 2017-04-25 ENCOUNTER — Ambulatory Visit: Payer: Medicare Other | Admitting: Cardiovascular Disease

## 2017-05-04 ENCOUNTER — Encounter: Payer: Self-pay | Admitting: Cardiovascular Disease

## 2017-05-04 ENCOUNTER — Ambulatory Visit (INDEPENDENT_AMBULATORY_CARE_PROVIDER_SITE_OTHER): Payer: Medicare Other | Admitting: Cardiovascular Disease

## 2017-05-04 VITALS — BP 122/70 | HR 67 | Ht 71.0 in | Wt 186.0 lb

## 2017-05-04 DIAGNOSIS — R002 Palpitations: Secondary | ICD-10-CM

## 2017-05-04 DIAGNOSIS — I10 Essential (primary) hypertension: Secondary | ICD-10-CM | POA: Diagnosis not present

## 2017-05-04 DIAGNOSIS — I38 Endocarditis, valve unspecified: Secondary | ICD-10-CM | POA: Diagnosis not present

## 2017-05-04 DIAGNOSIS — I209 Angina pectoris, unspecified: Secondary | ICD-10-CM

## 2017-05-04 DIAGNOSIS — I25118 Atherosclerotic heart disease of native coronary artery with other forms of angina pectoris: Secondary | ICD-10-CM | POA: Diagnosis not present

## 2017-05-04 MED ORDER — HYDROCHLOROTHIAZIDE 25 MG PO TABS: 12.5000 mg | ORAL_TABLET | Freq: Every day | ORAL | 3 refills | Status: DC

## 2017-05-04 MED ORDER — LOSARTAN POTASSIUM 25 MG PO TABS: 25.0000 mg | ORAL_TABLET | Freq: Every day | ORAL | 3 refills | Status: DC

## 2017-05-04 NOTE — Progress Notes (Signed)
SUBJECTIVE: The patient presents for follow-up of palpitations, coronary artery disease, and hypertension. He has a history of single-vessel coronary artery disease with a 40% left main lesion. He has no other significant coronary artery disease. He also has HTN and a former h/o tobacco abuse.  He had an echocardiogram performed on 07/01/2014 at an outside facility which reportedly demonstrated normal left ventricular systolic function, EF 78-58%, mild LVH, diastolic dysfunction, aortic valve sclerosis without stenosis, moderate mitral regurgitation, and mild tricuspid and pulmonic regurgitation.  ECG performed in the office today which I ordered and personally interpreted demonstrates normal sinus rhythm with no ischemic ST segment or T-wave abnormalities, nor any arrhythmias.  He denies exertional chest tightness, palpitations, leg swelling, lightheadedness, dizziness, and syncope. He has been slightly more short of breath only when working outdoors in the heat and humidity. This does not occur when he is working indoors.   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   Review of Systems: As per "subjective", otherwise negative.  No Known Allergies  Current Outpatient Prescriptions  Medication Sig Dispense Refill  . amLODipine (NORVASC) 10 MG tablet Take 10 mg by mouth daily.      Marland Kitchen aspirin 81 MG tablet Take 81 mg by mouth daily.      . fexofenadine (ALLEGRA) 180 MG tablet Take 180 mg by mouth daily as needed for allergies.     . fish oil-omega-3 fatty acids 1000 MG capsule Take 1 g by mouth 2 (two) times daily.     . meloxicam (MOBIC) 7.5 MG tablet Take 7.5 mg by mouth 2 (two) times daily.     . metoprolol succinate (TOPROL-XL) 25 MG 24 hr tablet Take 25 mg by mouth daily.    Marland Kitchen omeprazole (PRILOSEC) 20 MG capsule Take 20 mg by mouth daily.      . simvastatin (ZOCOR) 20 MG tablet Take 20 mg by mouth at bedtime.      . valsartan-hydrochlorothiazide (DIOVAN-HCT) 80-12.5 MG per tablet Take  1 tablet by mouth daily.       No current facility-administered medications for this visit.     Past Medical History:  Diagnosis Date  . Alcohol use   . Arthritis   . Barrett esophagus 5/09   first dx on EGD   . CAD (coronary artery disease)   . GERD (gastroesophageal reflux disease)   . Hemorrhoids, external   . HTN (hypertension)   . Hyperchloremia   . Osteoarthritis   . Personal history of tobacco use, presenting hazards to health   . Schatzki's ring 5/09   s/p dilation  . Umbilical hernia     Past Surgical History:  Procedure Laterality Date  . APPENDECTOMY    . COLONOSCOPY  2005   hyperplastic polyps,hemorrhoids  . COLONOSCOPY N/A 02/04/2014   Rourk: normal. consider one more screening in 10 years  . ESOPHAGOGASTRODUODENOSCOPY  02/06/08   RMR: short-segment Barrett's, Schatzki's ring s/p dilation   . ESOPHAGOGASTRODUODENOSCOPY  July 2010   RMR: short-segment Barrett's  . ESOPHAGOGASTRODUODENOSCOPY N/A 03/02/2016   Fundic gland polyps, Barrett's without dysplasia. Next EGD June 2020.  Marland Kitchen ESOPHAGOGASTRODUODENOSCOPY (EGD) WITH ESOPHAGEAL DILATION  08/21/2012   RMR: noncritical Schatzki ring, small hh, short-segment Barretts with no dysplasia.H.pylori gastritis  . KNEE SURGERY  2011   both knees-Bil knee replacement  . NECK SURGERY  2008   plate  . TONSILLECTOMY      Social History   Social History  . Marital status: Married  Spouse name: N/A  . Number of children: N/A  . Years of education: N/A   Occupational History  . retired     from [employer]  . part-time     [employer]    Social History Main Topics  . Smoking status: Former Smoker    Packs/day: 1.00    Years: 20.00    Types: Cigarettes    Start date: 07/24/1954    Quit date: 09/27/1984  . Smokeless tobacco: Never Used  . Alcohol use 0.0 oz/week     Comment: 3 beers a day  . Drug use: No  . Sexual activity: Yes    Birth control/ protection: None   Other Topics Concern  . Not on file   Social  History Narrative  . No narrative on file     Vitals:   05/04/17 1342  BP: 122/70  Pulse: 67  SpO2: 96%  Weight: 186 lb (84.4 kg)  Height: 5\' 11"  (1.803 m)    Wt Readings from Last 3 Encounters:  05/04/17 186 lb (84.4 kg)  03/25/17 182 lb 12.8 oz (82.9 kg)  03/31/16 165 lb (74.8 kg)     PHYSICAL EXAM General: NAD HEENT: Normal. Neck: No JVD, no thyromegaly. Lungs: Clear to auscultation bilaterally with normal respiratory effort. CV: Nondisplaced PMI.  Regular rate and rhythm, normal S1/S2, no S3/S4, no murmur. No pretibial or periankle edema.  No carotid bruit.   Abdomen: Soft, nontender, no distention.  Neurologic: Alert and oriented.  Psych: Normal affect. Skin: Normal. Musculoskeletal: No gross deformities.    ECG: Most recent ECG reviewed.   Labs: Lab Results  Component Value Date/Time   K 3.6 REPEATED TO VERIFY DELTA CHECK NOTED 08/09/2010 04:00 AM   BUN 9 08/09/2010 04:00 AM   CREATININE 0.72 08/09/2010 04:00 AM   ALT 18 07/28/2010 11:50 AM   HGB 10.8 (L) 08/09/2010 04:00 AM     Lipids: No results found for: LDLCALC, LDLDIRECT, CHOL, TRIG, HDL     ASSESSMENT AND PLAN: 1. Palpitations: Symptomatically stable on Toprol-XL 25 mg daily. No changes.  2. CAD: Stable ischemic heart disease. On ASA, statin, and beta blocker. No changes to therapy indicated.  3. Essential HTN: Well controlled on current therapy which includes amlodipine and valsartan-HCTZ. Given Diovan recall, I will switch valsartan to losartan 25 mg and continue hydrochlorothiazide 12.5 mg.  4. Valvular heart disease: Symptomatically stable with no appreciable murmurs.      Disposition: Follow up 1 yr   Kate Sable, M.D., F.A.C.C.

## 2017-05-04 NOTE — Patient Instructions (Signed)
Medication Instructions:  Your physician has recommended you make the following change in your medication:   Stop Diovan  Begin losartan 25 mg daily  Begin hydrochlorothiazide 12.5 mg daily  Please continue all other medications as prescribed  Labwork: NONE  Testing/Procedures: NONE  Follow-Up: Your physician wants you to follow-up in: [place]. You will receive a reminder letter in the mail two months in advance. If you don't receive a letter, please call our office to schedule the follow-up appointment.  Any Other Special Instructions Will Be Listed Below (If Applicable).  If you need a refill on your cardiac medications before your next appointment, please call your pharmacy.

## 2017-05-10 DIAGNOSIS — I471 Supraventricular tachycardia: Secondary | ICD-10-CM | POA: Diagnosis not present

## 2017-05-10 DIAGNOSIS — I10 Essential (primary) hypertension: Secondary | ICD-10-CM | POA: Diagnosis not present

## 2017-05-10 DIAGNOSIS — E7800 Pure hypercholesterolemia, unspecified: Secondary | ICD-10-CM | POA: Diagnosis not present

## 2017-05-10 DIAGNOSIS — Z6826 Body mass index (BMI) 26.0-26.9, adult: Secondary | ICD-10-CM | POA: Diagnosis not present

## 2017-05-10 DIAGNOSIS — Z299 Encounter for prophylactic measures, unspecified: Secondary | ICD-10-CM | POA: Diagnosis not present

## 2017-06-23 DIAGNOSIS — H524 Presbyopia: Secondary | ICD-10-CM | POA: Diagnosis not present

## 2017-06-23 DIAGNOSIS — H2512 Age-related nuclear cataract, left eye: Secondary | ICD-10-CM | POA: Diagnosis not present

## 2017-06-23 DIAGNOSIS — H25012 Cortical age-related cataract, left eye: Secondary | ICD-10-CM | POA: Diagnosis not present

## 2017-06-23 DIAGNOSIS — H2513 Age-related nuclear cataract, bilateral: Secondary | ICD-10-CM | POA: Diagnosis not present

## 2017-06-23 DIAGNOSIS — H25013 Cortical age-related cataract, bilateral: Secondary | ICD-10-CM | POA: Diagnosis not present

## 2017-06-23 DIAGNOSIS — H35363 Drusen (degenerative) of macula, bilateral: Secondary | ICD-10-CM | POA: Diagnosis not present

## 2017-06-27 DIAGNOSIS — M531 Cervicobrachial syndrome: Secondary | ICD-10-CM | POA: Diagnosis not present

## 2017-06-27 DIAGNOSIS — M9901 Segmental and somatic dysfunction of cervical region: Secondary | ICD-10-CM | POA: Diagnosis not present

## 2017-06-27 DIAGNOSIS — M47812 Spondylosis without myelopathy or radiculopathy, cervical region: Secondary | ICD-10-CM | POA: Diagnosis not present

## 2017-07-19 DIAGNOSIS — H2512 Age-related nuclear cataract, left eye: Secondary | ICD-10-CM | POA: Diagnosis not present

## 2017-07-19 DIAGNOSIS — H25812 Combined forms of age-related cataract, left eye: Secondary | ICD-10-CM | POA: Diagnosis not present

## 2017-07-26 DIAGNOSIS — Z23 Encounter for immunization: Secondary | ICD-10-CM | POA: Diagnosis not present

## 2017-08-01 DIAGNOSIS — H2511 Age-related nuclear cataract, right eye: Secondary | ICD-10-CM | POA: Diagnosis not present

## 2017-08-01 DIAGNOSIS — H25011 Cortical age-related cataract, right eye: Secondary | ICD-10-CM | POA: Diagnosis not present

## 2017-08-09 DIAGNOSIS — H25811 Combined forms of age-related cataract, right eye: Secondary | ICD-10-CM | POA: Diagnosis not present

## 2017-08-09 DIAGNOSIS — H2511 Age-related nuclear cataract, right eye: Secondary | ICD-10-CM | POA: Diagnosis not present

## 2017-08-16 DIAGNOSIS — M47812 Spondylosis without myelopathy or radiculopathy, cervical region: Secondary | ICD-10-CM | POA: Diagnosis not present

## 2017-09-09 DIAGNOSIS — Z7189 Other specified counseling: Secondary | ICD-10-CM | POA: Diagnosis not present

## 2017-09-09 DIAGNOSIS — E7800 Pure hypercholesterolemia, unspecified: Secondary | ICD-10-CM | POA: Diagnosis not present

## 2017-09-09 DIAGNOSIS — Z79899 Other long term (current) drug therapy: Secondary | ICD-10-CM | POA: Diagnosis not present

## 2017-09-09 DIAGNOSIS — Z6827 Body mass index (BMI) 27.0-27.9, adult: Secondary | ICD-10-CM | POA: Diagnosis not present

## 2017-09-09 DIAGNOSIS — M542 Cervicalgia: Secondary | ICD-10-CM | POA: Diagnosis not present

## 2017-09-09 DIAGNOSIS — Z0000 Encounter for general adult medical examination without abnormal findings: Secondary | ICD-10-CM | POA: Diagnosis not present

## 2017-09-09 DIAGNOSIS — R5383 Other fatigue: Secondary | ICD-10-CM | POA: Diagnosis not present

## 2017-09-09 DIAGNOSIS — Z125 Encounter for screening for malignant neoplasm of prostate: Secondary | ICD-10-CM | POA: Diagnosis not present

## 2017-09-09 DIAGNOSIS — K219 Gastro-esophageal reflux disease without esophagitis: Secondary | ICD-10-CM | POA: Diagnosis not present

## 2017-09-09 DIAGNOSIS — I10 Essential (primary) hypertension: Secondary | ICD-10-CM | POA: Diagnosis not present

## 2017-09-09 DIAGNOSIS — Z1339 Encounter for screening examination for other mental health and behavioral disorders: Secondary | ICD-10-CM | POA: Diagnosis not present

## 2017-09-09 DIAGNOSIS — Z299 Encounter for prophylactic measures, unspecified: Secondary | ICD-10-CM | POA: Diagnosis not present

## 2017-09-09 DIAGNOSIS — Z1211 Encounter for screening for malignant neoplasm of colon: Secondary | ICD-10-CM | POA: Diagnosis not present

## 2017-09-09 DIAGNOSIS — Z1331 Encounter for screening for depression: Secondary | ICD-10-CM | POA: Diagnosis not present

## 2017-09-28 DIAGNOSIS — M47812 Spondylosis without myelopathy or radiculopathy, cervical region: Secondary | ICD-10-CM | POA: Diagnosis not present

## 2017-09-28 DIAGNOSIS — M9901 Segmental and somatic dysfunction of cervical region: Secondary | ICD-10-CM | POA: Diagnosis not present

## 2017-09-28 DIAGNOSIS — M531 Cervicobrachial syndrome: Secondary | ICD-10-CM | POA: Diagnosis not present

## 2017-12-21 DIAGNOSIS — M47812 Spondylosis without myelopathy or radiculopathy, cervical region: Secondary | ICD-10-CM | POA: Diagnosis not present

## 2017-12-21 DIAGNOSIS — M531 Cervicobrachial syndrome: Secondary | ICD-10-CM | POA: Diagnosis not present

## 2017-12-21 DIAGNOSIS — M9901 Segmental and somatic dysfunction of cervical region: Secondary | ICD-10-CM | POA: Diagnosis not present

## 2018-02-08 DIAGNOSIS — I471 Supraventricular tachycardia: Secondary | ICD-10-CM | POA: Diagnosis not present

## 2018-02-08 DIAGNOSIS — Z299 Encounter for prophylactic measures, unspecified: Secondary | ICD-10-CM | POA: Diagnosis not present

## 2018-02-08 DIAGNOSIS — Z713 Dietary counseling and surveillance: Secondary | ICD-10-CM | POA: Diagnosis not present

## 2018-02-08 DIAGNOSIS — Z6827 Body mass index (BMI) 27.0-27.9, adult: Secondary | ICD-10-CM | POA: Diagnosis not present

## 2018-02-08 DIAGNOSIS — I10 Essential (primary) hypertension: Secondary | ICD-10-CM | POA: Diagnosis not present

## 2018-02-14 DIAGNOSIS — M47812 Spondylosis without myelopathy or radiculopathy, cervical region: Secondary | ICD-10-CM | POA: Diagnosis not present

## 2018-03-02 DIAGNOSIS — M9901 Segmental and somatic dysfunction of cervical region: Secondary | ICD-10-CM | POA: Diagnosis not present

## 2018-03-02 DIAGNOSIS — M531 Cervicobrachial syndrome: Secondary | ICD-10-CM | POA: Diagnosis not present

## 2018-03-02 DIAGNOSIS — M47812 Spondylosis without myelopathy or radiculopathy, cervical region: Secondary | ICD-10-CM | POA: Diagnosis not present

## 2018-05-11 DIAGNOSIS — Z7982 Long term (current) use of aspirin: Secondary | ICD-10-CM | POA: Diagnosis not present

## 2018-05-11 DIAGNOSIS — R110 Nausea: Secondary | ICD-10-CM | POA: Diagnosis not present

## 2018-05-11 DIAGNOSIS — N410 Acute prostatitis: Secondary | ICD-10-CM | POA: Diagnosis not present

## 2018-05-11 DIAGNOSIS — R319 Hematuria, unspecified: Secondary | ICD-10-CM | POA: Diagnosis not present

## 2018-05-11 DIAGNOSIS — K219 Gastro-esophageal reflux disease without esophagitis: Secondary | ICD-10-CM | POA: Diagnosis not present

## 2018-05-11 DIAGNOSIS — I10 Essential (primary) hypertension: Secondary | ICD-10-CM | POA: Diagnosis not present

## 2018-05-11 DIAGNOSIS — R112 Nausea with vomiting, unspecified: Secondary | ICD-10-CM | POA: Diagnosis not present

## 2018-05-11 DIAGNOSIS — Z79899 Other long term (current) drug therapy: Secondary | ICD-10-CM | POA: Diagnosis not present

## 2018-05-15 DIAGNOSIS — R42 Dizziness and giddiness: Secondary | ICD-10-CM | POA: Diagnosis not present

## 2018-05-15 DIAGNOSIS — I10 Essential (primary) hypertension: Secondary | ICD-10-CM | POA: Diagnosis not present

## 2018-05-15 DIAGNOSIS — Z299 Encounter for prophylactic measures, unspecified: Secondary | ICD-10-CM | POA: Diagnosis not present

## 2018-05-15 DIAGNOSIS — N419 Inflammatory disease of prostate, unspecified: Secondary | ICD-10-CM | POA: Diagnosis not present

## 2018-05-15 DIAGNOSIS — Z6828 Body mass index (BMI) 28.0-28.9, adult: Secondary | ICD-10-CM | POA: Diagnosis not present

## 2018-05-15 DIAGNOSIS — I471 Supraventricular tachycardia: Secondary | ICD-10-CM | POA: Diagnosis not present

## 2018-05-16 DIAGNOSIS — M47812 Spondylosis without myelopathy or radiculopathy, cervical region: Secondary | ICD-10-CM | POA: Diagnosis not present

## 2018-05-17 DIAGNOSIS — M9901 Segmental and somatic dysfunction of cervical region: Secondary | ICD-10-CM | POA: Diagnosis not present

## 2018-05-17 DIAGNOSIS — M531 Cervicobrachial syndrome: Secondary | ICD-10-CM | POA: Diagnosis not present

## 2018-05-17 DIAGNOSIS — M47812 Spondylosis without myelopathy or radiculopathy, cervical region: Secondary | ICD-10-CM | POA: Diagnosis not present

## 2018-07-03 DIAGNOSIS — M47812 Spondylosis without myelopathy or radiculopathy, cervical region: Secondary | ICD-10-CM | POA: Diagnosis not present

## 2018-07-03 DIAGNOSIS — M9901 Segmental and somatic dysfunction of cervical region: Secondary | ICD-10-CM | POA: Diagnosis not present

## 2018-07-03 DIAGNOSIS — M531 Cervicobrachial syndrome: Secondary | ICD-10-CM | POA: Diagnosis not present

## 2018-07-04 DIAGNOSIS — M531 Cervicobrachial syndrome: Secondary | ICD-10-CM | POA: Diagnosis not present

## 2018-07-04 DIAGNOSIS — M47812 Spondylosis without myelopathy or radiculopathy, cervical region: Secondary | ICD-10-CM | POA: Diagnosis not present

## 2018-07-04 DIAGNOSIS — M9901 Segmental and somatic dysfunction of cervical region: Secondary | ICD-10-CM | POA: Diagnosis not present

## 2018-07-07 DIAGNOSIS — M531 Cervicobrachial syndrome: Secondary | ICD-10-CM | POA: Diagnosis not present

## 2018-07-07 DIAGNOSIS — M9901 Segmental and somatic dysfunction of cervical region: Secondary | ICD-10-CM | POA: Diagnosis not present

## 2018-07-07 DIAGNOSIS — M47812 Spondylosis without myelopathy or radiculopathy, cervical region: Secondary | ICD-10-CM | POA: Diagnosis not present

## 2018-07-12 DIAGNOSIS — M9901 Segmental and somatic dysfunction of cervical region: Secondary | ICD-10-CM | POA: Diagnosis not present

## 2018-07-12 DIAGNOSIS — M531 Cervicobrachial syndrome: Secondary | ICD-10-CM | POA: Diagnosis not present

## 2018-07-12 DIAGNOSIS — M47812 Spondylosis without myelopathy or radiculopathy, cervical region: Secondary | ICD-10-CM | POA: Diagnosis not present

## 2018-07-20 DIAGNOSIS — M9901 Segmental and somatic dysfunction of cervical region: Secondary | ICD-10-CM | POA: Diagnosis not present

## 2018-07-20 DIAGNOSIS — M531 Cervicobrachial syndrome: Secondary | ICD-10-CM | POA: Diagnosis not present

## 2018-07-20 DIAGNOSIS — M47812 Spondylosis without myelopathy or radiculopathy, cervical region: Secondary | ICD-10-CM | POA: Diagnosis not present

## 2018-07-24 DIAGNOSIS — Z23 Encounter for immunization: Secondary | ICD-10-CM | POA: Diagnosis not present

## 2018-07-28 DIAGNOSIS — M9901 Segmental and somatic dysfunction of cervical region: Secondary | ICD-10-CM | POA: Diagnosis not present

## 2018-07-28 DIAGNOSIS — M531 Cervicobrachial syndrome: Secondary | ICD-10-CM | POA: Diagnosis not present

## 2018-07-28 DIAGNOSIS — M47812 Spondylosis without myelopathy or radiculopathy, cervical region: Secondary | ICD-10-CM | POA: Diagnosis not present

## 2018-08-09 DIAGNOSIS — M9901 Segmental and somatic dysfunction of cervical region: Secondary | ICD-10-CM | POA: Diagnosis not present

## 2018-08-09 DIAGNOSIS — M47812 Spondylosis without myelopathy or radiculopathy, cervical region: Secondary | ICD-10-CM | POA: Diagnosis not present

## 2018-08-09 DIAGNOSIS — M531 Cervicobrachial syndrome: Secondary | ICD-10-CM | POA: Diagnosis not present

## 2018-08-16 DIAGNOSIS — M47812 Spondylosis without myelopathy or radiculopathy, cervical region: Secondary | ICD-10-CM | POA: Diagnosis not present

## 2018-09-08 DIAGNOSIS — M47812 Spondylosis without myelopathy or radiculopathy, cervical region: Secondary | ICD-10-CM | POA: Diagnosis not present

## 2018-09-08 DIAGNOSIS — M9901 Segmental and somatic dysfunction of cervical region: Secondary | ICD-10-CM | POA: Diagnosis not present

## 2018-09-08 DIAGNOSIS — M531 Cervicobrachial syndrome: Secondary | ICD-10-CM | POA: Diagnosis not present

## 2018-09-15 DIAGNOSIS — I10 Essential (primary) hypertension: Secondary | ICD-10-CM | POA: Diagnosis not present

## 2018-09-15 DIAGNOSIS — Z6827 Body mass index (BMI) 27.0-27.9, adult: Secondary | ICD-10-CM | POA: Diagnosis not present

## 2018-09-15 DIAGNOSIS — Z7189 Other specified counseling: Secondary | ICD-10-CM | POA: Diagnosis not present

## 2018-09-15 DIAGNOSIS — I471 Supraventricular tachycardia: Secondary | ICD-10-CM | POA: Diagnosis not present

## 2018-09-15 DIAGNOSIS — Z125 Encounter for screening for malignant neoplasm of prostate: Secondary | ICD-10-CM | POA: Diagnosis not present

## 2018-09-15 DIAGNOSIS — Z79899 Other long term (current) drug therapy: Secondary | ICD-10-CM | POA: Diagnosis not present

## 2018-09-15 DIAGNOSIS — Z0000 Encounter for general adult medical examination without abnormal findings: Secondary | ICD-10-CM | POA: Diagnosis not present

## 2018-09-15 DIAGNOSIS — E7800 Pure hypercholesterolemia, unspecified: Secondary | ICD-10-CM | POA: Diagnosis not present

## 2018-09-15 DIAGNOSIS — R5383 Other fatigue: Secondary | ICD-10-CM | POA: Diagnosis not present

## 2018-09-15 DIAGNOSIS — Z1331 Encounter for screening for depression: Secondary | ICD-10-CM | POA: Diagnosis not present

## 2018-09-15 DIAGNOSIS — Z1339 Encounter for screening examination for other mental health and behavioral disorders: Secondary | ICD-10-CM | POA: Diagnosis not present

## 2018-09-15 DIAGNOSIS — Z1211 Encounter for screening for malignant neoplasm of colon: Secondary | ICD-10-CM | POA: Diagnosis not present

## 2018-09-15 DIAGNOSIS — Z299 Encounter for prophylactic measures, unspecified: Secondary | ICD-10-CM | POA: Diagnosis not present

## 2018-09-26 ENCOUNTER — Encounter: Payer: Self-pay | Admitting: Cardiovascular Disease

## 2018-09-26 ENCOUNTER — Ambulatory Visit (INDEPENDENT_AMBULATORY_CARE_PROVIDER_SITE_OTHER): Payer: Medicare Other | Admitting: Cardiovascular Disease

## 2018-09-26 VITALS — BP 122/74 | HR 71 | Ht 70.5 in | Wt 189.0 lb

## 2018-09-26 DIAGNOSIS — E785 Hyperlipidemia, unspecified: Secondary | ICD-10-CM

## 2018-09-26 DIAGNOSIS — I38 Endocarditis, valve unspecified: Secondary | ICD-10-CM

## 2018-09-26 DIAGNOSIS — I10 Essential (primary) hypertension: Secondary | ICD-10-CM

## 2018-09-26 DIAGNOSIS — R002 Palpitations: Secondary | ICD-10-CM | POA: Diagnosis not present

## 2018-09-26 DIAGNOSIS — I25118 Atherosclerotic heart disease of native coronary artery with other forms of angina pectoris: Secondary | ICD-10-CM

## 2018-09-26 NOTE — Progress Notes (Signed)
SUBJECTIVE: The patient presents for follow-up of palpitations, coronary artery disease, and hypertension. He has a history of single-vessel coronary artery disease with a 40% left main lesion. He has no other significant coronary artery disease. He also has HTN and a former h/o tobacco abuse.  He had an echocardiogram performed on 07/01/2014 at an outside facility which reportedly demonstrated normal left ventricular systolic function, EF 84-16%, mild LVH, diastolic dysfunction, aortic valve sclerosis without stenosis, moderate mitral regurgitation, and mild tricuspid and pulmonic regurgitation.  ECG performed in the office today which I ordered and personally interpreted demonstrates normal sinus rhythm with no ischemic ST segment or T-wave abnormalities, nor any arrhythmias.  The patient denies any symptoms of chest pain, palpitations, shortness of breath, lightheadedness, dizziness, leg swelling, orthopnea, PND, and syncope.     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   Review of Systems: As per "subjective", otherwise negative.  No Known Allergies  Current Outpatient Medications  Medication Sig Dispense Refill  . amLODipine (NORVASC) 10 MG tablet Take 10 mg by mouth daily.      Marland Kitchen aspirin 81 MG tablet Take 81 mg by mouth daily.      . fexofenadine (ALLEGRA) 180 MG tablet Take 180 mg by mouth daily as needed for allergies.     . fish oil-omega-3 fatty acids 1000 MG capsule Take 1 g by mouth 2 (two) times daily.     . irbesartan-hydrochlorothiazide (AVALIDE) 150-12.5 MG tablet Take 1 tablet by mouth daily.    . meloxicam (MOBIC) 7.5 MG tablet Take 7.5 mg by mouth 2 (two) times daily.     . metoprolol succinate (TOPROL-XL) 25 MG 24 hr tablet Take 25 mg by mouth daily.    Marland Kitchen omeprazole (PRILOSEC) 20 MG capsule Take 20 mg by mouth daily.      . simvastatin (ZOCOR) 20 MG tablet Take 20 mg by mouth at bedtime.       No current facility-administered medications for this  visit.     Past Medical History:  Diagnosis Date  . Alcohol use   . Arthritis   . Barrett esophagus 5/09   first dx on EGD   . CAD (coronary artery disease)   . GERD (gastroesophageal reflux disease)   . Hemorrhoids, external   . HTN (hypertension)   . Hyperchloremia   . Osteoarthritis   . Personal history of tobacco use, presenting hazards to health   . Schatzki's ring 5/09   s/p dilation  . Umbilical hernia     Past Surgical History:  Procedure Laterality Date  . APPENDECTOMY    . COLONOSCOPY  2005   hyperplastic polyps,hemorrhoids  . COLONOSCOPY N/A 02/04/2014   Rourk: normal. consider one more screening in 10 years  . ESOPHAGOGASTRODUODENOSCOPY  02/06/08   RMR: short-segment Barrett's, Schatzki's ring s/p dilation   . ESOPHAGOGASTRODUODENOSCOPY  July 2010   RMR: short-segment Barrett's  . ESOPHAGOGASTRODUODENOSCOPY N/A 03/02/2016   Fundic gland polyps, Barrett's without dysplasia. Next EGD June 2020.  Marland Kitchen ESOPHAGOGASTRODUODENOSCOPY (EGD) WITH ESOPHAGEAL DILATION  08/21/2012   RMR: noncritical Schatzki ring, small hh, short-segment Barretts with no dysplasia.H.pylori gastritis  . KNEE SURGERY  2011   both knees-Bil knee replacement  . NECK SURGERY  2008   plate  . TONSILLECTOMY      Social History   Socioeconomic History  . Marital status: Married    Spouse name: Not on file  . Number of children: Not on file  .  Years of education: Not on file  . Highest education level: Not on file  Occupational History  . Occupation: retired    Comment: from [employer]  . Occupation: part-time    Comment: [employer]   Social Needs  . Financial resource strain: Not on file  . Food insecurity:    Worry: Not on file    Inability: Not on file  . Transportation needs:    Medical: Not on file    Non-medical: Not on file  Tobacco Use  . Smoking status: Former Smoker    Packs/day: 1.00    Years: 20.00    Pack years: 20.00    Types: Cigarettes    Start date: 07/24/1954    Last  attempt to quit: 09/27/1984    Years since quitting: 34.0  . Smokeless tobacco: Never Used  Substance and Sexual Activity  . Alcohol use: Yes    Alcohol/week: 0.0 standard drinks    Comment: 3 beers a day  . Drug use: No  . Sexual activity: Yes    Birth control/protection: None  Lifestyle  . Physical activity:    Days per week: Not on file    Minutes per session: Not on file  . Stress: Not on file  Relationships  . Social connections:    Talks on phone: Not on file    Gets together: Not on file    Attends religious service: Not on file    Active member of club or organization: Not on file    Attends meetings of clubs or organizations: Not on file    Relationship status: Not on file  . Intimate partner violence:    Fear of current or ex partner: Not on file    Emotionally abused: Not on file    Physically abused: Not on file    Forced sexual activity: Not on file  Other Topics Concern  . Not on file  Social History Narrative  . Not on file     Vitals:   09/26/18 1304  BP: 122/74  Pulse: 71  SpO2: 97%  Weight: 189 lb (85.7 kg)  Height: 5' 10.5" (1.791 m)    Wt Readings from Last 3 Encounters:  09/26/18 189 lb (85.7 kg)  05/04/17 186 lb (84.4 kg)  03/25/17 182 lb 12.8 oz (82.9 kg)     PHYSICAL EXAM General: NAD HEENT: Normal. Neck: No JVD, no thyromegaly. Lungs: Clear to auscultation bilaterally with normal respiratory effort. CV: Regular rate and rhythm, normal S1/S2, no S3/S4, no murmur. No pretibial or periankle edema.  No carotid bruit.   Abdomen: Soft, nontender, no distention.  Neurologic: Alert and oriented.  Psych: Normal affect. Skin: Normal. Musculoskeletal: No gross deformities.    ECG: Reviewed above under Subjective   Labs: Lab Results  Component Value Date/Time   K 3.6 REPEATED TO VERIFY DELTA CHECK NOTED 08/09/2010 04:00 AM   BUN 9 08/09/2010 04:00 AM   CREATININE 0.72 08/09/2010 04:00 AM   ALT 18 07/28/2010 11:50 AM   HGB 10.8 (L)  08/09/2010 04:00 AM     Lipids: No results found for: LDLCALC, LDLDIRECT, CHOL, TRIG, HDL     ASSESSMENT AND PLAN:  1. Palpitations: Symptomatically stable on Toprol-XL 25 mg daily. No changes.  2. CAD: Stable ischemic heart disease. On ASA, statin, and beta blocker. No changes to therapy indicated.  3. Essential HTN: Controlled on present therapy.  No changes.  4. Valvular heart disease: Symptomatically stable with no appreciable murmurs.  5.  Hypercholesterolemia: I  will obtain a copy of lipids from PCP.  Continue statin.    Disposition: Follow up 1 year  Kate Sable, M.D., F.A.C.C.

## 2018-09-26 NOTE — Patient Instructions (Signed)

## 2018-09-29 ENCOUNTER — Encounter: Payer: Self-pay | Admitting: *Deleted

## 2018-10-03 DIAGNOSIS — Z299 Encounter for prophylactic measures, unspecified: Secondary | ICD-10-CM | POA: Diagnosis not present

## 2018-10-03 DIAGNOSIS — Z6827 Body mass index (BMI) 27.0-27.9, adult: Secondary | ICD-10-CM | POA: Diagnosis not present

## 2018-10-03 DIAGNOSIS — I776 Arteritis, unspecified: Secondary | ICD-10-CM | POA: Diagnosis not present

## 2018-10-03 DIAGNOSIS — I471 Supraventricular tachycardia: Secondary | ICD-10-CM | POA: Diagnosis not present

## 2018-10-03 DIAGNOSIS — I10 Essential (primary) hypertension: Secondary | ICD-10-CM | POA: Diagnosis not present

## 2018-10-03 DIAGNOSIS — R7309 Other abnormal glucose: Secondary | ICD-10-CM | POA: Diagnosis not present

## 2018-10-06 DIAGNOSIS — M47812 Spondylosis without myelopathy or radiculopathy, cervical region: Secondary | ICD-10-CM | POA: Diagnosis not present

## 2018-10-06 DIAGNOSIS — M9901 Segmental and somatic dysfunction of cervical region: Secondary | ICD-10-CM | POA: Diagnosis not present

## 2018-10-06 DIAGNOSIS — M531 Cervicobrachial syndrome: Secondary | ICD-10-CM | POA: Diagnosis not present

## 2018-11-02 DIAGNOSIS — M9901 Segmental and somatic dysfunction of cervical region: Secondary | ICD-10-CM | POA: Diagnosis not present

## 2018-11-02 DIAGNOSIS — M531 Cervicobrachial syndrome: Secondary | ICD-10-CM | POA: Diagnosis not present

## 2018-11-02 DIAGNOSIS — M47812 Spondylosis without myelopathy or radiculopathy, cervical region: Secondary | ICD-10-CM | POA: Diagnosis not present

## 2018-11-21 DIAGNOSIS — M47812 Spondylosis without myelopathy or radiculopathy, cervical region: Secondary | ICD-10-CM | POA: Diagnosis not present

## 2018-11-30 DIAGNOSIS — M9901 Segmental and somatic dysfunction of cervical region: Secondary | ICD-10-CM | POA: Diagnosis not present

## 2018-11-30 DIAGNOSIS — M531 Cervicobrachial syndrome: Secondary | ICD-10-CM | POA: Diagnosis not present

## 2018-11-30 DIAGNOSIS — M47812 Spondylosis without myelopathy or radiculopathy, cervical region: Secondary | ICD-10-CM | POA: Diagnosis not present

## 2018-12-27 DIAGNOSIS — I10 Essential (primary) hypertension: Secondary | ICD-10-CM | POA: Diagnosis not present

## 2018-12-27 DIAGNOSIS — Z299 Encounter for prophylactic measures, unspecified: Secondary | ICD-10-CM | POA: Diagnosis not present

## 2018-12-27 DIAGNOSIS — Z713 Dietary counseling and surveillance: Secondary | ICD-10-CM | POA: Diagnosis not present

## 2019-02-01 DIAGNOSIS — M9901 Segmental and somatic dysfunction of cervical region: Secondary | ICD-10-CM | POA: Diagnosis not present

## 2019-02-01 DIAGNOSIS — M531 Cervicobrachial syndrome: Secondary | ICD-10-CM | POA: Diagnosis not present

## 2019-02-01 DIAGNOSIS — M47812 Spondylosis without myelopathy or radiculopathy, cervical region: Secondary | ICD-10-CM | POA: Diagnosis not present

## 2019-03-01 DIAGNOSIS — M9901 Segmental and somatic dysfunction of cervical region: Secondary | ICD-10-CM | POA: Diagnosis not present

## 2019-03-01 DIAGNOSIS — M47812 Spondylosis without myelopathy or radiculopathy, cervical region: Secondary | ICD-10-CM | POA: Diagnosis not present

## 2019-03-01 DIAGNOSIS — M531 Cervicobrachial syndrome: Secondary | ICD-10-CM | POA: Diagnosis not present

## 2019-03-06 ENCOUNTER — Encounter: Payer: Self-pay | Admitting: Internal Medicine

## 2019-03-20 DIAGNOSIS — D692 Other nonthrombocytopenic purpura: Secondary | ICD-10-CM | POA: Diagnosis not present

## 2019-03-20 DIAGNOSIS — I471 Supraventricular tachycardia: Secondary | ICD-10-CM | POA: Diagnosis not present

## 2019-03-20 DIAGNOSIS — Z299 Encounter for prophylactic measures, unspecified: Secondary | ICD-10-CM | POA: Diagnosis not present

## 2019-03-20 DIAGNOSIS — Z6827 Body mass index (BMI) 27.0-27.9, adult: Secondary | ICD-10-CM | POA: Diagnosis not present

## 2019-03-20 DIAGNOSIS — I10 Essential (primary) hypertension: Secondary | ICD-10-CM | POA: Diagnosis not present

## 2019-03-29 DIAGNOSIS — M47812 Spondylosis without myelopathy or radiculopathy, cervical region: Secondary | ICD-10-CM | POA: Diagnosis not present

## 2019-03-29 DIAGNOSIS — M531 Cervicobrachial syndrome: Secondary | ICD-10-CM | POA: Diagnosis not present

## 2019-03-29 DIAGNOSIS — M9901 Segmental and somatic dysfunction of cervical region: Secondary | ICD-10-CM | POA: Diagnosis not present

## 2019-04-26 DIAGNOSIS — M531 Cervicobrachial syndrome: Secondary | ICD-10-CM | POA: Diagnosis not present

## 2019-04-26 DIAGNOSIS — M9901 Segmental and somatic dysfunction of cervical region: Secondary | ICD-10-CM | POA: Diagnosis not present

## 2019-04-26 DIAGNOSIS — M47812 Spondylosis without myelopathy or radiculopathy, cervical region: Secondary | ICD-10-CM | POA: Diagnosis not present

## 2019-05-02 ENCOUNTER — Other Ambulatory Visit: Payer: Self-pay

## 2019-05-02 ENCOUNTER — Encounter: Payer: Self-pay | Admitting: *Deleted

## 2019-05-02 ENCOUNTER — Ambulatory Visit (INDEPENDENT_AMBULATORY_CARE_PROVIDER_SITE_OTHER): Payer: Medicare Other | Admitting: Gastroenterology

## 2019-05-02 ENCOUNTER — Encounter: Payer: Self-pay | Admitting: Gastroenterology

## 2019-05-02 ENCOUNTER — Other Ambulatory Visit: Payer: Self-pay | Admitting: *Deleted

## 2019-05-02 VITALS — BP 116/67 | HR 79 | Temp 97.1°F | Ht 71.0 in | Wt 189.8 lb

## 2019-05-02 DIAGNOSIS — K2270 Barrett's esophagus without dysplasia: Secondary | ICD-10-CM

## 2019-05-02 NOTE — Progress Notes (Signed)
Primary Care Physician:  Glenda Chroman, MD  Primary Gastroenterologist:  Garfield Cornea, MD   Chief Complaint  Patient presents with  . barrett's esophagus    due for 3 yr EGD. no concerns    HPI:  LEOMAR WESTBERG is a 78 y.o. male here for f/u Barrett's esophagus without dysplasia.  Last EGD June 2017, Barrett's without dysplasia, multiple fundic gland polyps.  Plan for surveillance EGD June 2020.  Clinically doing well.  Denies heartburn on omeprazole.  No dysphagia.  No abdominal pain.  No bowel concerns.  No blood in stool or melena.  Previously has done well with conscious sedation, augmentation with Phenergan 12.5 mg IV.  Patient cannot interested in deep sedation with propofol at this time.  Current Outpatient Medications  Medication Sig Dispense Refill  . amLODipine (NORVASC) 10 MG tablet Take 10 mg by mouth daily.      Marland Kitchen aspirin 81 MG tablet Take 81 mg by mouth daily.      . fexofenadine (ALLEGRA) 180 MG tablet Take 180 mg by mouth daily as needed for allergies.     . fish oil-omega-3 fatty acids 1000 MG capsule Take 1 g by mouth 2 (two) times daily.     . irbesartan-hydrochlorothiazide (AVALIDE) 150-12.5 MG tablet Take 1 tablet by mouth daily.    . meloxicam (MOBIC) 7.5 MG tablet Take 7.5 mg by mouth 2 (two) times daily.     . metoprolol succinate (TOPROL-XL) 25 MG 24 hr tablet Take 25 mg by mouth daily.    Marland Kitchen omeprazole (PRILOSEC) 20 MG capsule Take 20 mg by mouth daily.      . simvastatin (ZOCOR) 20 MG tablet Take 20 mg by mouth at bedtime.       No current facility-administered medications for this visit.     Allergies as of 05/02/2019  . (No Known Allergies)    Past Medical History:  Diagnosis Date  . Alcohol use   . Arthritis   . Barrett esophagus 5/09   first dx on EGD   . CAD (coronary artery disease)   . GERD (gastroesophageal reflux disease)   . Hemorrhoids, external   . HTN (hypertension)   . Hyperchloremia   . Osteoarthritis   . Personal history of  tobacco use, presenting hazards to health   . Schatzki's ring 5/09   s/p dilation  . Umbilical hernia     Past Surgical History:  Procedure Laterality Date  . APPENDECTOMY    . COLONOSCOPY  2005   hyperplastic polyps,hemorrhoids  . COLONOSCOPY N/A 02/04/2014   Rourk: normal. consider one more screening in 10 years  . ESOPHAGOGASTRODUODENOSCOPY  02/06/08   RMR: short-segment Barrett's, Schatzki's ring s/p dilation   . ESOPHAGOGASTRODUODENOSCOPY  July 2010   RMR: short-segment Barrett's  . ESOPHAGOGASTRODUODENOSCOPY N/A 03/02/2016   Fundic gland polyps, Barrett's without dysplasia. Next EGD June 2020.  Marland Kitchen ESOPHAGOGASTRODUODENOSCOPY (EGD) WITH ESOPHAGEAL DILATION  08/21/2012   RMR: noncritical Schatzki ring, small hh, short-segment Barretts with no dysplasia.H.pylori gastritis  . KNEE SURGERY  2011   both knees-Bil knee replacement  . NECK SURGERY  2008   plate  . TONSILLECTOMY      Family History  Problem Relation Age of Onset  . Pancreatic cancer Father 84    Social History   Socioeconomic History  . Marital status: Married    Spouse name: Not on file  . Number of children: Not on file  . Years of education: Not on file  . Highest  education level: Not on file  Occupational History  . Occupation: retired    Comment: from [employer]  . Occupation: part-time    Comment: [employer]   Social Needs  . Financial resource strain: Not on file  . Food insecurity    Worry: Not on file    Inability: Not on file  . Transportation needs    Medical: Not on file    Non-medical: Not on file  Tobacco Use  . Smoking status: Former Smoker    Packs/day: 1.00    Years: 20.00    Pack years: 20.00    Types: Cigarettes    Start date: 07/24/1954    Quit date: 09/27/1984    Years since quitting: 34.6  . Smokeless tobacco: Never Used  Substance and Sexual Activity  . Alcohol use: Yes    Alcohol/week: 0.0 standard drinks    Comment: 2 beers a day but not daily  . Drug use: No  . Sexual  activity: Yes    Birth control/protection: None  Lifestyle  . Physical activity    Days per week: Not on file    Minutes per session: Not on file  . Stress: Not on file  Relationships  . Social Herbalist on phone: Not on file    Gets together: Not on file    Attends religious service: Not on file    Active member of club or organization: Not on file    Attends meetings of clubs or organizations: Not on file    Relationship status: Not on file  . Intimate partner violence    Fear of current or ex partner: Not on file    Emotionally abused: Not on file    Physically abused: Not on file    Forced sexual activity: Not on file  Other Topics Concern  . Not on file  Social History Narrative  . Not on file      ROS:  General: Negative for anorexia, weight loss, fever, chills, fatigue, weakness. Eyes: Negative for vision changes.  ENT: Negative for hoarseness, difficulty swallowing , nasal congestion. CV: Negative for chest pain, angina, palpitations, dyspnea on exertion, peripheral edema.  Respiratory: Negative for dyspnea at rest, dyspnea on exertion, cough, sputum, wheezing.  GI: See history of present illness. GU:  Negative for dysuria, hematuria, urinary incontinence, urinary frequency, nocturnal urination.  MS: Negative for joint pain, low back pain.  Derm: Negative for rash or itching.  Neuro: Negative for weakness, abnormal sensation, seizure, frequent headaches, memory loss, confusion.  Psych: Negative for anxiety, depression, suicidal ideation, hallucinations.  Endo: Negative for unusual weight change.  Heme: Negative for bruising or bleeding. Allergy: Negative for rash or hives.    Physical Examination:  BP 116/67   Pulse 79   Temp (!) 97.1 F (36.2 C) (Oral)   Ht 5\' 11"  (1.803 m)   Wt 189 lb 12.8 oz (86.1 kg)   BMI 26.47 kg/m    General: Well-nourished, well-developed in no acute distress.  Head: Normocephalic, atraumatic.   Eyes: Conjunctiva  pink, no icterus. Mouth: Oropharyngeal mucosa moist and pink , no lesions erythema or exudate. Neck: Supple without thyromegaly, masses, or lymphadenopathy.  Lungs: Clear to auscultation bilaterally.  Heart: Regular rate and rhythm, no murmurs rubs or gallops.  Abdomen: Bowel sounds are normal, nontender, nondistended, no hepatosplenomegaly or masses, no abdominal bruits or    hernia , no rebound or guarding.   Rectal: Not performed Extremities: No lower extremity edema. No clubbing  or deformities.  Neuro: Alert and oriented x 4 , grossly normal neurologically.  Skin: Warm and dry, no rash or jaundice.   Psych: Alert and cooperative, normal mood and affect.

## 2019-05-02 NOTE — Patient Instructions (Signed)
1. Upper endoscopy as schedule. See separate instructions.

## 2019-05-02 NOTE — Assessment & Plan Note (Signed)
Patient is due for surveillance EGD at this time.  GERD well managed on omeprazole daily.  Previously did well with low-dose conscious sedation/augmentation with Phenergan.  Patient prefers sedation as in the past as opposed to consideration of deep sedation with propofol.  I have discussed the risks, alternatives, benefits with regards to but not limited to the risk of reaction to medication, bleeding, infection, perforation and the patient is agreeable to proceed. Written consent to be obtained.

## 2019-05-03 NOTE — Progress Notes (Signed)
CC'D TO PCP °

## 2019-06-25 ENCOUNTER — Other Ambulatory Visit (HOSPITAL_COMMUNITY)
Admission: RE | Admit: 2019-06-25 | Discharge: 2019-06-25 | Disposition: A | Discharge status: Home / Self Care | Payer: Medicare Other | Source: Ambulatory Visit | Attending: Internal Medicine | Admitting: Internal Medicine

## 2019-06-25 DIAGNOSIS — Z20828 Contact with and (suspected) exposure to other viral communicable diseases: Secondary | ICD-10-CM | POA: Insufficient documentation

## 2019-06-25 DIAGNOSIS — Z01812 Encounter for preprocedural laboratory examination: Secondary | ICD-10-CM | POA: Diagnosis not present

## 2019-06-25 LAB — SARS CORONAVIRUS 2 (TAT 6-24 HRS): SARS Coronavirus 2: NEGATIVE

## 2019-06-27 ENCOUNTER — Encounter (HOSPITAL_COMMUNITY): Payer: Self-pay | Admitting: *Deleted

## 2019-06-27 ENCOUNTER — Encounter (HOSPITAL_COMMUNITY)
Admission: RE | Disposition: A | Discharge status: Home / Self Care | Payer: Self-pay | Source: Home / Self Care | Attending: Internal Medicine

## 2019-06-27 ENCOUNTER — Other Ambulatory Visit: Payer: Self-pay

## 2019-06-27 ENCOUNTER — Ambulatory Visit (HOSPITAL_COMMUNITY)
Admission: RE | Admit: 2019-06-27 | Discharge: 2019-06-27 | Disposition: A | Discharge status: Home / Self Care | Payer: Medicare Other | Attending: Internal Medicine | Admitting: Internal Medicine

## 2019-06-27 DIAGNOSIS — I10 Essential (primary) hypertension: Secondary | ICD-10-CM | POA: Insufficient documentation

## 2019-06-27 DIAGNOSIS — K219 Gastro-esophageal reflux disease without esophagitis: Secondary | ICD-10-CM | POA: Diagnosis not present

## 2019-06-27 DIAGNOSIS — K2270 Barrett's esophagus without dysplasia: Secondary | ICD-10-CM | POA: Diagnosis not present

## 2019-06-27 DIAGNOSIS — K449 Diaphragmatic hernia without obstruction or gangrene: Secondary | ICD-10-CM | POA: Diagnosis not present

## 2019-06-27 DIAGNOSIS — Z79899 Other long term (current) drug therapy: Secondary | ICD-10-CM | POA: Diagnosis not present

## 2019-06-27 DIAGNOSIS — I2510 Atherosclerotic heart disease of native coronary artery without angina pectoris: Secondary | ICD-10-CM | POA: Insufficient documentation

## 2019-06-27 DIAGNOSIS — Z7982 Long term (current) use of aspirin: Secondary | ICD-10-CM | POA: Insufficient documentation

## 2019-06-27 DIAGNOSIS — Z87891 Personal history of nicotine dependence: Secondary | ICD-10-CM | POA: Diagnosis not present

## 2019-06-27 DIAGNOSIS — Z791 Long term (current) use of non-steroidal anti-inflammatories (NSAID): Secondary | ICD-10-CM | POA: Insufficient documentation

## 2019-06-27 HISTORY — PX: ESOPHAGOGASTRODUODENOSCOPY: SHX5428

## 2019-06-27 HISTORY — PX: BIOPSY: SHX5522

## 2019-06-27 SURGERY — EGD (ESOPHAGOGASTRODUODENOSCOPY)
Anesthesia: Moderate Sedation

## 2019-06-27 MED ORDER — ONDANSETRON HCL 4 MG/2ML IJ SOLN
INTRAMUSCULAR | Status: AC
  Filled 2019-06-27: qty 2

## 2019-06-27 MED ORDER — LIDOCAINE VISCOUS HCL 2 % MT SOLN
OROMUCOSAL | Status: AC
  Filled 2019-06-27: qty 15

## 2019-06-27 MED ORDER — PROMETHAZINE HCL 25 MG/ML IJ SOLN: 12.5000 mg | Freq: Once | INTRAMUSCULAR | Status: DC

## 2019-06-27 MED ORDER — LIDOCAINE VISCOUS HCL 2 % MT SOLN
OROMUCOSAL | Status: DC | PRN
  Administered 2019-06-27: 1 via OROMUCOSAL

## 2019-06-27 MED ORDER — MIDAZOLAM HCL 5 MG/5ML IJ SOLN
INTRAMUSCULAR | Status: DC | PRN
  Administered 2019-06-27: 1 mg via INTRAVENOUS
  Administered 2019-06-27 (×2): 2 mg via INTRAVENOUS

## 2019-06-27 MED ORDER — STERILE WATER FOR IRRIGATION IR SOLN
Status: DC | PRN
  Administered 2019-06-27: 2.5 mL

## 2019-06-27 MED ORDER — SODIUM CHLORIDE 0.9 % IV SOLN
INTRAVENOUS | Status: DC
  Administered 2019-06-27: 09:00:00 via INTRAVENOUS

## 2019-06-27 MED ORDER — ONDANSETRON HCL 4 MG/2ML IJ SOLN
INTRAMUSCULAR | Status: DC | PRN
  Administered 2019-06-27: 4 mg via INTRAVENOUS

## 2019-06-27 MED ORDER — MEPERIDINE HCL 100 MG/ML IJ SOLN
INTRAMUSCULAR | Status: DC | PRN
  Administered 2019-06-27 (×2): 25 mg via INTRAVENOUS

## 2019-06-27 MED ORDER — MEPERIDINE HCL 50 MG/ML IJ SOLN
INTRAMUSCULAR | Status: AC
  Filled 2019-06-27: qty 1

## 2019-06-27 MED ORDER — MIDAZOLAM HCL 5 MG/5ML IJ SOLN
INTRAMUSCULAR | Status: AC
  Filled 2019-06-27: qty 10

## 2019-06-27 NOTE — Discharge Instructions (Signed)

## 2019-06-27 NOTE — H&P (Signed)
@LOGO @   Primary Care Physician:  Glenda Chroman, MD Primary Gastroenterologist:  Dr. Gala Romney  Pre-Procedure History & Physical: HPI:  Todd Nelson is a 78 y.o. male here for further evaluation/surveillance of short segment Barrett's esophagus.  Reflux symptoms well controlled on Prilosec 20 mg daily.  No dysphagia.  Past Medical History:  Diagnosis Date  . Alcohol use   . Arthritis   . Barrett esophagus 5/09   first dx on EGD   . CAD (coronary artery disease)   . GERD (gastroesophageal reflux disease)   . Hemorrhoids, external   . HTN (hypertension)   . Hyperchloremia   . Osteoarthritis   . Personal history of tobacco use, presenting hazards to health   . Schatzki's ring 5/09   s/p dilation  . Umbilical hernia     Past Surgical History:  Procedure Laterality Date  . APPENDECTOMY    . COLONOSCOPY  2005   hyperplastic polyps,hemorrhoids  . COLONOSCOPY N/A 02/04/2014   [name]: normal. consider one more screening in 10 years  . ESOPHAGOGASTRODUODENOSCOPY  02/06/08   RMR: short-segment Barrett's, Schatzki's ring s/p dilation   . ESOPHAGOGASTRODUODENOSCOPY  July 2010   RMR: short-segment Barrett's  . ESOPHAGOGASTRODUODENOSCOPY N/A 03/02/2016   Fundic gland polyps, Barrett's without dysplasia. Next EGD June 2020.  Marland Kitchen ESOPHAGOGASTRODUODENOSCOPY (EGD) WITH ESOPHAGEAL DILATION  08/21/2012   RMR: noncritical Schatzki ring, small hh, short-segment Barretts with no dysplasia.H.pylori gastritis  . KNEE SURGERY  2011   both knees-Bil knee replacement  . NECK SURGERY  2008   plate  . TONSILLECTOMY      Prior to Admission medications   Medication Sig Start Date End Date Taking? Authorizing Provider  amLODipine (NORVASC) 10 MG tablet Take 10 mg by mouth daily.     Yes [provider]  aspirin 81 MG tablet Take 81 mg by mouth daily.     Yes [provider]  fexofenadine (ALLEGRA) 180 MG tablet Take 180 mg by mouth daily as needed for allergies.    Yes [provider]  fish oil-omega-3 fatty acids 1000 MG capsule Take 1 g by mouth daily.    Yes [provider]  irbesartan-hydrochlorothiazide (AVALIDE) 150-12.5 MG tablet Take 1 tablet by mouth daily.   Yes [provider]  meloxicam (MOBIC) 7.5 MG tablet Take 7.5 mg by mouth 2 (two) times daily.    Yes [provider]  metoprolol succinate (TOPROL-XL) 25 MG 24 hr tablet Take 25 mg by mouth daily.   Yes [provider]  omeprazole (PRILOSEC) 20 MG capsule Take 20 mg by mouth daily.     Yes [provider]  simvastatin (ZOCOR) 20 MG tablet Take 20 mg by mouth at bedtime.     Yes [provider]    Allergies as of 05/02/2019  . (No Known Allergies)    Family History  Problem Relation Age of Onset  . Pancreatic cancer Father 14    Social History   Socioeconomic History  . Marital status: Married    Spouse name: Not on file  . Number of children: Not on file  . Years of education: Not on file  . Highest education level: Not on file  Occupational History  . Occupation: retired    Comment: from [employer]  . Occupation: part-time    Comment: [employer]   Social Needs  . Financial resource strain: Not on file  . Food insecurity    Worry: Not on file    Inability: Not  on file  . Transportation needs    Medical: Not on file    Non-medical: Not on file  Tobacco Use  . Smoking status: Former Smoker    Packs/day: 1.00    Years: 20.00    Pack years: 20.00    Types: Cigarettes    Start date: 07/24/1954    Quit date: 09/27/1984    Years since quitting: 34.7  . Smokeless tobacco: Never Used  Substance and Sexual Activity  . Alcohol use: Yes    Alcohol/week: 0.0 standard drinks    Comment: 2 beers a day but not daily  . Drug use: No  . Sexual activity: Yes    Birth control/protection: None  Lifestyle  . Physical activity    Days per week: Not on file    Minutes per session: Not on file  . Stress: Not on file  Relationships  .  Social Herbalist on phone: Not on file    Gets together: Not on file    Attends religious service: Not on file    Active member of club or organization: Not on file    Attends meetings of clubs or organizations: Not on file    Relationship status: Not on file  . Intimate partner violence    Fear of current or ex partner: Not on file    Emotionally abused: Not on file    Physically abused: Not on file    Forced sexual activity: Not on file  Other Topics Concern  . Not on file  Social History Narrative  . Not on file    Review of Systems: See HPI, otherwise negative ROS  Physical Exam: BP (!) 141/76   Pulse 65   Temp 97.7 F (36.5 C) (Oral)   Resp 18   Ht 5' 10.5" (1.791 m)   Wt 83.9 kg   SpO2 100%   BMI 26.17 kg/m  General:   Alert,  Well-developed, well-nourished, pleasant and cooperative in NAD SNeck:  Supple; no masses or thyromegaly. No significant cervical adenopathy. Lungs:  Clear throughout to auscultation.   No wheezes, crackles, or rhonchi. No acute distress. Heart:  Regular rate and rhythm; no murmurs, clicks, rubs,  or gallops. Abdomen: Non-distended, normal bowel sounds.  Soft and nontender without appreciable mass or hepatosplenomegaly.  Pulses:  Normal pulses noted. Extremities:  Without clubbing or edema.  Impression/Plan: 78 year old gentleman with a history of short segment Barrett's esophagus.  No dysplasia noted previously.  Here for 3-year surveillance examination.  The risks, benefits, limitations, alternatives and imponderables have been reviewed with the patient. Potential for esophageal dilation, biopsy, etc. have also been reviewed.  Questions have been answered. All parties agreeable.     Notice: This dictation was prepared with Dragon dictation along with smaller phrase technology. Any transcriptional errors that result from this process are unintentional and may not be corrected upon review.

## 2019-06-27 NOTE — Op Note (Signed)
[facility] Patient Name: Todd Nelson Procedure Date: 06/27/2019 8:51 AM MRN: [id number] Date of Birth: January 01, 1941 Attending MD: Norvel Richards , MD CSN: [id number] Age: 78 Admit Type: Outpatient Procedure:                Upper GI endoscopy Indications:              Surveillance for malignancy due to personal history                            of Barrett's esophagus Providers:                Norvel Richards, MD, Lurline Del, RN, Aram Candela Referring MD:              Medicines:                Midazolam 5 mg IV, Meperidine 25 mg IV, Ondansetron                            4 mg IV Complications:            No immediate complications. Estimated Blood Loss:     Estimated blood loss was minimal. Procedure:                Pre-Anesthesia Assessment:                           - Prior to the procedure, a History and Physical                            was performed, and patient medications and                            allergies were reviewed. The patient's tolerance of                            previous anesthesia was also reviewed. The risks                            and benefits of the procedure and the sedation                            options and risks were discussed with the patient.                            All questions were answered, and informed consent                            was obtained. Prior Anticoagulants: The patient has                            taken no previous anticoagulant or antiplatelet  agents. ASA Grade Assessment: II - A patient with                            mild systemic disease. After reviewing the risks                            and benefits, the patient was deemed in                            satisfactory condition to undergo the procedure.                           After obtaining informed consent, the endoscope was                            passed under direct vision.  Throughout the                            procedure, the patient's blood pressure, pulse, and                            oxygen saturations were monitored continuously. The                            [id number]) scope was introduced through the                            mouth, and advanced to the second part of duodenum.                            The upper GI endoscopy was accomplished without                            difficulty. The patient tolerated the procedure                            well. Scope In: 9:26:08 AM Scope Out: 9:31:21 AM Total Procedure Duration: 0 hours 5 minutes 13 seconds  Findings:      Mucosal changes were found in the esophagus. Undulating Z line at the GE       junction. Very subtle concern for Barrett's; no nodularity. No       esophagitis. This area was biopsied with a cold forceps for histology.      A small hiatal hernia was present.      The stomach was otherwise without abnormality.      The second portion of the duodenum was normal. Impression:               - Mucosal changes in the esophagus. Biopsied.                           - Small hiatal hernia.                           - The examination was otherwise normal.                           -  Normal second portion of the duodenum. Moderate Sedation:      Moderate (conscious) sedation was administered by the endoscopy nurse       and supervised by the endoscopist. The following parameters were       monitored: oxygen saturation, heart rate, blood pressure, respiratory       rate, EKG, adequacy of pulmonary ventilation, and response to care.       Total physician intraservice time was 17 minutes. Recommendation:           - Patient has a contact number available for                            emergencies. The signs and symptoms of potential                            delayed complications were discussed with the                            patient. Return to normal activities tomorrow.                             Written discharge instructions were provided to the                            patient.                           - Advance diet as tolerated.                           - Continue present medications. Continue Prilosec                            20 mg daily indefinitely.                           - Repeat upper endoscopy after studies are complete                            for surveillance based on pathology results.                           - Return to GI clinic in 1 year. Procedure Code(s):        --- Professional ---                           [phone], Esophagogastroduodenoscopy, flexible,                            transoral; with biopsy, single or multiple                           G0500, Moderate sedation services provided by the                            same physician or other qualified health care  professional performing a gastrointestinal                            endoscopic service that sedation supports,                            requiring the presence of an independent trained                            observer to assist in the monitoring of the                            patient's level of consciousness and physiological                            status; initial 15 minutes of intra-service time;                            patient age 41 years or older (additional time may                            be reported with [phone], as appropriate) Diagnosis Code(s):        --- Professional ---                           K22.8, Other specified diseases of esophagus                           K44.9, Diaphragmatic hernia without obstruction or                            gangrene                           K22.70, Barrett's esophagus without dysplasia CPT copyright 2019 [employer]. All rights reserved. The codes documented in this report are preliminary and upon coder review may  be revised to meet current compliance  requirements. Cristopher Estimable. Malcomb Gangemi, MD Norvel Richards, MD 06/27/2019 9:44:39 AM This report has been signed electronically. Number of Addenda: 0

## 2019-06-28 ENCOUNTER — Encounter: Payer: Self-pay | Admitting: Internal Medicine

## 2019-06-28 LAB — SURGICAL PATHOLOGY

## 2019-07-02 ENCOUNTER — Encounter (HOSPITAL_COMMUNITY): Payer: Self-pay | Admitting: Internal Medicine

## 2019-07-04 ENCOUNTER — Encounter: Payer: Self-pay | Admitting: Internal Medicine

## 2019-07-11 DIAGNOSIS — M47812 Spondylosis without myelopathy or radiculopathy, cervical region: Secondary | ICD-10-CM | POA: Diagnosis not present

## 2019-08-03 DIAGNOSIS — Z23 Encounter for immunization: Secondary | ICD-10-CM | POA: Diagnosis not present

## 2019-09-14 ENCOUNTER — Emergency Department (HOSPITAL_COMMUNITY): Payer: Medicare Other

## 2019-09-14 ENCOUNTER — Other Ambulatory Visit: Payer: Self-pay

## 2019-09-14 ENCOUNTER — Emergency Department (HOSPITAL_COMMUNITY)
Admission: EM | Admit: 2019-09-14 | Discharge: 2019-09-15 | Disposition: A | Discharge status: Home / Self Care | Payer: Medicare Other | Attending: Emergency Medicine | Admitting: Emergency Medicine

## 2019-09-14 ENCOUNTER — Encounter (HOSPITAL_COMMUNITY): Payer: Self-pay

## 2019-09-14 DIAGNOSIS — I2510 Atherosclerotic heart disease of native coronary artery without angina pectoris: Secondary | ICD-10-CM | POA: Diagnosis not present

## 2019-09-14 DIAGNOSIS — R1111 Vomiting without nausea: Secondary | ICD-10-CM | POA: Diagnosis not present

## 2019-09-14 DIAGNOSIS — K5289 Other specified noninfective gastroenteritis and colitis: Secondary | ICD-10-CM | POA: Diagnosis not present

## 2019-09-14 DIAGNOSIS — I10 Essential (primary) hypertension: Secondary | ICD-10-CM | POA: Diagnosis not present

## 2019-09-14 DIAGNOSIS — Z743 Need for continuous supervision: Secondary | ICD-10-CM | POA: Diagnosis not present

## 2019-09-14 DIAGNOSIS — Z209 Contact with and (suspected) exposure to unspecified communicable disease: Secondary | ICD-10-CM | POA: Diagnosis not present

## 2019-09-14 DIAGNOSIS — R110 Nausea: Secondary | ICD-10-CM | POA: Diagnosis not present

## 2019-09-14 DIAGNOSIS — U071 COVID-19: Secondary | ICD-10-CM | POA: Diagnosis not present

## 2019-09-14 DIAGNOSIS — Z87891 Personal history of nicotine dependence: Secondary | ICD-10-CM | POA: Insufficient documentation

## 2019-09-14 DIAGNOSIS — R112 Nausea with vomiting, unspecified: Secondary | ICD-10-CM

## 2019-09-14 LAB — CBC
HCT: 46.3 % (ref 39.0–52.0)
Hemoglobin: 15.8 g/dL (ref 13.0–17.0)
MCH: 31.3 pg (ref 26.0–34.0)
MCHC: 34.1 g/dL (ref 30.0–36.0)
MCV: 91.7 fL (ref 80.0–100.0)
Platelets: 233 10*3/uL (ref 150–400)
RBC: 5.05 MIL/uL (ref 4.22–5.81)
RDW: 12.5 % (ref 11.5–15.5)
WBC: 8.8 10*3/uL (ref 4.0–10.5)
nRBC: 0 % (ref 0.0–0.2)

## 2019-09-14 LAB — COMPREHENSIVE METABOLIC PANEL
ALT: 32 U/L (ref 0–44)
AST: 29 U/L (ref 15–41)
Albumin: 4.3 g/dL (ref 3.5–5.0)
Alkaline Phosphatase: 75 U/L (ref 38–126)
Anion gap: 12 (ref 5–15)
BUN: 22 mg/dL (ref 8–23)
CO2: 25 mmol/L (ref 22–32)
Calcium: 9.5 mg/dL (ref 8.9–10.3)
Chloride: 98 mmol/L (ref 98–111)
Creatinine, Ser: 0.7 mg/dL (ref 0.61–1.24)
GFR calc Af Amer: >60 mL/min (ref >60)
GFR calc non Af Amer: >60 mL/min (ref >60)
Glucose, Bld: 161 mg/dL — ABNORMAL HIGH (ref 70–99)
Potassium: 3.7 mmol/L (ref 3.5–5.1)
Sodium: 135 mmol/L (ref 135–145)
Total Bilirubin: 0.8 mg/dL (ref 0.3–1.2)
Total Protein: 7.7 g/dL (ref 6.5–8.1)

## 2019-09-14 LAB — LIPASE, BLOOD: Lipase: 32 U/L (ref 11–51)

## 2019-09-14 LAB — POC SARS CORONAVIRUS 2 AG -  ED: SARS Coronavirus 2 Ag: POSITIVE — AB

## 2019-09-14 LAB — LACTIC ACID, PLASMA: Lactic Acid, Venous: 1.3 mmol/L (ref 0.5–1.9)

## 2019-09-14 MED ORDER — SODIUM CHLORIDE 0.9 % IV BOLUS
1000.0000 mL | Freq: Once | INTRAVENOUS | Status: AC
  Administered 2019-09-14: 1000 mL via INTRAVENOUS

## 2019-09-14 MED ORDER — ONDANSETRON HCL 4 MG/2ML IJ SOLN
4.0000 mg | Freq: Once | INTRAMUSCULAR | Status: AC
  Administered 2019-09-14: 4 mg via INTRAVENOUS
  Filled 2019-09-14: qty 2

## 2019-09-14 NOTE — ED Notes (Signed)
EKG done at 2158 and seen by Dr Sedonia Small

## 2019-09-14 NOTE — ED Provider Notes (Signed)
[facility] Emergency Department Provider Note MRN:  [id number]  Arrival date & time: 09/14/19     Chief Complaint   Emesis History of Present Illness   Todd Nelson is a 78 y.o. year-old male with a history of hypertension, CAD presenting to the ED with chief complaint of emesis.  Patient began feeling generally unwell with emesis 3 days ago, tested positive for coronavirus next day.  Continues to have general malaise, vomiting, was unable to eat or drink this afternoon, came here for evaluation.  Denies chest pain or shortness of breath, endorsing abdominal bloating.  Also endorsing complete lack of passing gas today.  No stools today.  Review of Systems  A complete 10 system review of systems was obtained and all systems are negative except as noted in the HPI and PMH.   Patient's Health History    Past Medical History:  Diagnosis Date  . Alcohol use   . Arthritis   . Barrett esophagus 5/09   first dx on EGD   . CAD (coronary artery disease)   . GERD (gastroesophageal reflux disease)   . Hemorrhoids, external   . HTN (hypertension)   . Hyperchloremia   . Osteoarthritis   . Personal history of tobacco use, presenting hazards to health   . Schatzki's ring 5/09   s/p dilation  . Umbilical hernia     Past Surgical History:  Procedure Laterality Date  . APPENDECTOMY    . BIOPSY  06/27/2019   Procedure: BIOPSY;  Surgeon: Daneil Dolin, MD;  [facility];  Service: Endoscopy;;  esophagus  . COLONOSCOPY  2005   hyperplastic polyps,hemorrhoids  . COLONOSCOPY N/A 02/04/2014   Rourk: normal. consider one more screening in 10 years  . ESOPHAGOGASTRODUODENOSCOPY  02/06/08   RMR: short-segment Barrett's, Schatzki's ring s/p dilation   . ESOPHAGOGASTRODUODENOSCOPY  July 2010   RMR: short-segment Barrett's  . ESOPHAGOGASTRODUODENOSCOPY N/A 03/02/2016   Fundic gland polyps, Barrett's without dysplasia. Next EGD June 2020.  Marland Kitchen  ESOPHAGOGASTRODUODENOSCOPY N/A 06/27/2019   Procedure: ESOPHAGOGASTRODUODENOSCOPY (EGD);  Surgeon: Daneil Dolin, MD;  Location[facility];  Service: Endoscopy;  Laterality: N/A;  9:30am  . ESOPHAGOGASTRODUODENOSCOPY (EGD) WITH ESOPHAGEAL DILATION  08/21/2012   RMR: noncritical Schatzki ring, small hh, short-segment Barretts with no dysplasia.H.pylori gastritis  . KNEE SURGERY  2011   both knees-Bil knee replacement  . NECK SURGERY  2008   plate  . TONSILLECTOMY      Family History  Problem Relation Age of Onset  . Pancreatic cancer Father 37    Social History   Socioeconomic History  . Marital status: Married    Spouse name: Not on file  . Number of children: Not on file  . Years of education: Not on file  . Highest education level: Not on file  Occupational History  . Occupation: retired    Comment: from [employer]  . Occupation: part-time    Comment: [employer]   Tobacco Use  . Smoking status: Former Smoker    Packs/day: 1.00    Years: 20.00    Pack years: 20.00    Types: Cigarettes    Start date: 07/24/1954    Quit date: 09/27/1984    Years since quitting: 34.9  . Smokeless tobacco: Never Used  Substance and Sexual Activity  . Alcohol use: Yes    Alcohol/week: 0.0 standard drinks    Comment: 2 beers a day but not daily  . Drug use: No  . Sexual  activity: Yes    Birth control/protection: None  Other Topics Concern  . Not on file  Social History Narrative  . Not on file   Social Determinants of Health   Financial Resource Strain:   . Difficulty of Paying Living Expenses: Not on file  Food Insecurity:   . Worried About Charity fundraiser in the Last Year: Not on file  . Ran Out of Food in the Last Year: Not on file  Transportation Needs:   . Lack of Transportation (Medical): Not on file  . Lack of Transportation (Non-Medical): Not on file  Physical Activity:   . Days of Exercise per Week: Not on file  . Minutes of Exercise per Session: Not on file  Stress:     . Feeling of Stress : Not on file  Social Connections:   . Frequency of Communication with Friends and Family: Not on file  . Frequency of Social Gatherings with Friends and Family: Not on file  . Attends Religious Services: Not on file  . Active Member of Clubs or Organizations: Not on file  . Attends Archivist Meetings: Not on file  . Marital Status: Not on file  Intimate Partner Violence:   . Fear of Current or Ex-Partner: Not on file  . Emotionally Abused: Not on file  . Physically Abused: Not on file  . Sexually Abused: Not on file     Physical Exam  Vital Signs and Nursing Notes reviewed Vitals:   09/14/19 2130 09/14/19 2200  BP: (!) 153/89 136/66  Pulse: 73 68  Resp: 18 13  Temp:    SpO2: 96% 95%    CONSTITUTIONAL: Well-appearing, NAD NEURO:  Alert and oriented x 3, no focal deficits EYES:  eyes equal and reactive ENT/NECK:  no LAD, no JVD CARDIO: Regular rate, well-perfused, normal S1 and S2 PULM:  CTAB no wheezing or rhonchi GI/GU:  normal bowel sounds, mild distention, nontender MSK/SPINE:  No gross deformities, no edema SKIN:  no rash, atraumatic PSYCH:  Appropriate speech and behavior  Diagnostic and Interventional Summary    EKG Interpretation  Date/Time:  Friday September 14 2019 21:39:31 EST Ventricular Rate:  75 PR Interval:    QRS Duration: 95 QT Interval:  363 QTC Calculation: 406 R Axis:   49 Text Interpretation: Sinus rhythm Ventricular premature complex Prominent P waves, nondiagnostic No previous ECGs available Confirmed by Gerlene Fee [phone]) on 09/14/2019 10:41:55 PM      Labs Reviewed  COMPREHENSIVE METABOLIC PANEL - Abnormal; Notable for the following components:      Result Value   Glucose, Bld 161 (*)    All other components within normal limits  POC SARS CORONAVIRUS 2 AG -  ED - Abnormal; Notable for the following components:   SARS Coronavirus 2 Ag POSITIVE (*)    All other components within normal limits  LIPASE,  BLOOD  CBC  URINALYSIS, ROUTINE W REFLEX MICROSCOPIC  LACTIC ACID, PLASMA    CT Abdomen Pelvis W Contrast    (Results Pending)  XR Chest Single View    (Results Pending)    Medications  ondansetron (ZOFRAN) injection 4 mg (4 mg Intravenous Given 09/14/19 2344)  sodium chloride 0.9 % bolus 1,000 mL (1,000 mLs Intravenous New Bag/Given 09/14/19 2230)     Procedures  /  Critical Care Procedures  ED Course and Medical Decision Making  I have reviewed the triage vital signs and the nursing notes.  Pertinent labs & imaging results that were available  during my care of the patient were reviewed by me and considered in my medical decision making (see below for details).     Persistent emesis, most likely caused by COVID-19, but patient has a history appendectomy and is not passing gas today, raising concern for possible SBO.  Will obtain CT imaging to exclude.  Patient has normal vital signs, if able to tolerate p.o. would be a candidate for discharge.  Signed out to oncoming provider at shift change.    Barth Kirks. Sedonia Small, [place] [email or web address]  Final Clinical Impressions(s) / ED Diagnoses     ICD-10-CM   1. Non-intractable vomiting with nausea, unspecified vomiting type  R11.2   2. COVID-19  U07.1 XR Chest Single View    XR Chest Single View    ED Discharge Orders    None       Discharge Instructions Discussed with and Provided to Patient:   Discharge Instructions   None       Maudie Flakes, MD 09/14/19 2356

## 2019-09-14 NOTE — ED Triage Notes (Signed)
Pt dx with covid 2 days ago. Arrives via ems from home for n/v since 230pm. States that he has been having heaves every 30 min since then.  Pt is on prednisone, abx. and took phenergan without help  Pt is A&Ox4.

## 2019-09-15 ENCOUNTER — Emergency Department (HOSPITAL_COMMUNITY): Payer: Medicare Other

## 2019-09-15 DIAGNOSIS — U071 COVID-19: Secondary | ICD-10-CM | POA: Diagnosis not present

## 2019-09-15 MED ORDER — IOHEXOL 300 MG/ML  SOLN
100.0000 mL | Freq: Once | INTRAMUSCULAR | Status: AC | PRN
  Administered 2019-09-15: 100 mL via INTRAVENOUS

## 2019-09-15 MED ORDER — ONDANSETRON HCL 4 MG PO TABS: 4.0000 mg | ORAL_TABLET | Freq: Four times a day (QID) | ORAL | 0 refills | Status: DC | PRN

## 2019-09-15 NOTE — ED Provider Notes (Signed)
Patient presents to the emergency department with nausea and vomiting.  He was recently diagnosed with Covid.  X-ray did show some dilated loops of bowel, CT scan ordered.  Signed out to me for review.  Review of CT scan shows enteritis without obstruction.  No acute inflammatory process.   Orpah Greek, MD 09/15/19 [phone]

## 2019-09-15 NOTE — ED Notes (Signed)
Pt verbalized understanding of d/c instructions and attempted to sign esig pad but computer not responding.

## 2019-10-03 ENCOUNTER — Telehealth: Payer: Self-pay | Admitting: Cardiovascular Disease

## 2019-10-03 NOTE — Telephone Encounter (Signed)

## 2019-10-05 ENCOUNTER — Encounter: Payer: Self-pay | Admitting: Cardiovascular Disease

## 2019-10-05 ENCOUNTER — Telehealth (INDEPENDENT_AMBULATORY_CARE_PROVIDER_SITE_OTHER): Payer: Medicare Other | Admitting: Cardiovascular Disease

## 2019-10-05 ENCOUNTER — Telehealth: Payer: Self-pay | Admitting: Cardiovascular Disease

## 2019-10-05 VITALS — BP 115/64 | HR 76 | Ht 70.5 in | Wt 179.0 lb

## 2019-10-05 DIAGNOSIS — I10 Essential (primary) hypertension: Secondary | ICD-10-CM

## 2019-10-05 DIAGNOSIS — I25118 Atherosclerotic heart disease of native coronary artery with other forms of angina pectoris: Secondary | ICD-10-CM

## 2019-10-05 DIAGNOSIS — I38 Endocarditis, valve unspecified: Secondary | ICD-10-CM

## 2019-10-05 DIAGNOSIS — R002 Palpitations: Secondary | ICD-10-CM

## 2019-10-05 DIAGNOSIS — E785 Hyperlipidemia, unspecified: Secondary | ICD-10-CM

## 2019-10-05 NOTE — Patient Instructions (Signed)
Your physician wants you to follow-up in:1 YEAR WIT DR Virgina Jock will receive a reminder letter in the mail two months in advance. If you don't receive a letter, please call our office to schedule the follow-up appointment.  Your physician recommends that you continue on your current medications as directed. Please refer to the Current Medication list given to you today.  Your physician has requested that you have an echocardiogram. Echocardiography is a painless test that uses sound waves to create images of your heart. It provides your doctor with information about the size and shape of your heart and how well your heart's chambers and valves are working. This procedure takes approximately one hour. There are no restrictions for this procedure.    Thank you for choosing [facility]!!

## 2019-10-05 NOTE — Addendum Note (Signed)
Addended by: Julian Hy T on: 10/05/2019 10:59 AM   Modules accepted: Orders

## 2019-10-05 NOTE — Progress Notes (Signed)
Virtual Visit via Telephone Note   This visit type was conducted due to national recommendations for restrictions regarding the COVID-19 Pandemic (e.g. social distancing) in an effort to limit this patient's exposure and mitigate transmission in our community.  Due to his co-morbid illnesses, this patient is at least at moderate risk for complications without adequate follow up.  This format is felt to be most appropriate for this patient at this time.  The patient did not have access to video technology/had technical difficulties with video requiring transitioning to audio format only (telephone).  All issues noted in this document were discussed and addressed.  No physical exam could be performed with this format.  Please refer to the patient's chart for his  consent to telehealth for [facility].   Date:  10/05/2019   ID:  Todd Nelson, DOB Jan 28, 1941, MRN [id number]  Patient Location: Home Provider Location: Home  PCP:  Glenda Chroman, MD  Cardiologist:  Kate Sable, MD  Electrophysiologist:  None   Evaluation Performed:  Follow-Up Visit  Chief Complaint:  CAD  History of Present Illness:    Todd Nelson is a 79 y.o. male with palpitations, coronary artery disease, and hypertension. He has a history of single-vessel coronary artery disease with a 40% left main lesion. He has no other significant coronary artery disease. He also has HTN and a former h/o tobacco abuse.  He had an echocardiogram performed on 07/01/2014 at an outside facility which reportedly demonstrated normal left ventricular systolic function, EF [id number], mild LVH, diastolic dysfunction, aortic valve sclerosis without stenosis, moderate mitral regurgitation, and mild tricuspid and pulmonic regurgitation.  He was diagnosed with COVID-19 in December 2020.  An abdominal CT scan showed enteritis without obstruction on 09/15/2019.  He still feels weak and hasn't fully regained his appetite. He has a little  shortness of breath.  He denies chest pain, palpitations, and leg swelling.  Chest xray on 09/14/19 was suspicious for pneumonia.  He says "I'm a lot better than I was".  His wife also got COVID.   Past Medical History:  Diagnosis Date  . Alcohol use   . Arthritis   . Barrett esophagus 5/09   first dx on EGD   . CAD (coronary artery disease)   . GERD (gastroesophageal reflux disease)   . Hemorrhoids, external   . HTN (hypertension)   . Hyperchloremia   . Osteoarthritis   . Personal history of tobacco use, presenting hazards to health   . Schatzki's ring 5/09   s/p dilation  . Umbilical hernia    Past Surgical History:  Procedure Laterality Date  . APPENDECTOMY    . BIOPSY  06/27/2019   Procedure: BIOPSY;  Surgeon: Daneil Dolin, MD;  [facility];  Service: Endoscopy;;  esophagus  . COLONOSCOPY  2005   hyperplastic polyps,hemorrhoids  . COLONOSCOPY N/A 02/04/2014   Rourk: normal. consider one more screening in 10 years  . ESOPHAGOGASTRODUODENOSCOPY  02/06/08   RMR: short-segment Barrett's, Schatzki's ring s/p dilation   . ESOPHAGOGASTRODUODENOSCOPY  July 2010   RMR: short-segment Barrett's  . ESOPHAGOGASTRODUODENOSCOPY N/A 03/02/2016   Fundic gland polyps, Barrett's without dysplasia. Next EGD June 2020.  Marland Kitchen ESOPHAGOGASTRODUODENOSCOPY N/A 06/27/2019   Procedure: ESOPHAGOGASTRODUODENOSCOPY (EGD);  Surgeon: Daneil Dolin, MD;  [facility];  Service: Endoscopy;  Laterality: N/A;  9:30am  . ESOPHAGOGASTRODUODENOSCOPY (EGD) WITH ESOPHAGEAL DILATION  08/21/2012   RMR: noncritical Schatzki ring, small hh, short-segment Barretts with no dysplasia.H.pylori gastritis  .  KNEE SURGERY  2011   both knees-Bil knee replacement  . NECK SURGERY  2008   plate  . TONSILLECTOMY       Current Meds  Medication Sig  . amLODipine (NORVASC) 10 MG tablet Take 10 mg by mouth daily.    Marland Kitchen aspirin 81 MG tablet Take 81 mg by mouth daily.    . fexofenadine (ALLEGRA) 180  MG tablet Take 180 mg by mouth daily as needed for allergies.   . fish oil-omega-3 fatty acids 1000 MG capsule Take 1 g by mouth daily.   . irbesartan-hydrochlorothiazide (AVALIDE) 150-12.5 MG tablet Take 1 tablet by mouth daily.  . meloxicam (MOBIC) 7.5 MG tablet Take 7.5 mg by mouth 2 (two) times daily.   . metoprolol succinate (TOPROL-XL) 25 MG 24 hr tablet Take 25 mg by mouth daily.  Marland Kitchen omeprazole (PRILOSEC) 20 MG capsule Take 20 mg by mouth daily.    . simvastatin (ZOCOR) 20 MG tablet Take 20 mg by mouth at bedtime.    . [DISCONTINUED] ondansetron (ZOFRAN) 4 MG tablet Take 1 tablet (4 mg total) by mouth every 6 (six) hours as needed for nausea or vomiting.     Allergies:   Patient has no known allergies.   Social History   Tobacco Use  . Smoking status: Former Smoker    Packs/day: 1.00    Years: 20.00    Pack years: 20.00    Types: Cigarettes    Start date: 07/24/1954    Quit date: 09/27/1984    Years since quitting: 35.0  . Smokeless tobacco: Never Used  Substance Use Topics  . Alcohol use: Yes    Alcohol/week: 0.0 standard drinks    Comment: 2 beers a day but not daily  . Drug use: No     Family Hx: The patient's family history includes Pancreatic cancer (age of onset: 61) in his father.  ROS:   Please see the history of present illness.     All other systems reviewed and are negative.   Prior CV studies:   The following studies were reviewed today:  Reviewed above  Labs/Other Tests and Data Reviewed:    EKG:  No ECG reviewed.  Recent Labs: 09/14/2019: ALT 32; BUN 22; Creatinine, Ser 0.70; Hemoglobin 15.8; Platelets 233; Potassium 3.7; Sodium 135   Recent Lipid Panel No results found for: CHOL, TRIG, HDL, CHOLHDL, LDLCALC, LDLDIRECT  Wt Readings from Last 3 Encounters:  10/05/19 179 lb (81.2 kg)  09/14/19 180 lb (81.6 kg)  06/27/19 185 lb (83.9 kg)     Objective:    Vital Signs:  BP 115/64   Pulse 76   Ht 5' 10.5" (1.791 m)   Wt 179 lb (81.2 kg)    SpO2 96%   BMI 25.32 kg/m    VITAL SIGNS:  reviewed  ASSESSMENT & PLAN:    1. Palpitations: Symptomatically stable on Toprol-XL 25 mg daily. No changes.  2. CAD: Stable ischemic heart disease. On ASA, statin, and beta blocker. No changes to therapy indicated.  3. Essential HTN: Controlled on present therapy.  No changes.  4. Valvular heart disease:Symptomatically stable. I will order a 2-D echocardiogram with Doppler to evaluate cardiac structure, function, and regional wall motion.  5.  Hypercholesterolemia: Continue statin.   COVID-19 Education: The signs and symptoms of COVID-19 were discussed with the patient and how to seek care for testing (follow up with PCP or arrange E-visit).  The importance of social distancing was discussed today.  Time:  Today, I have spent 15 minutes with the patient with telehealth technology discussing the above problems.     Medication Adjustments/Labs and Tests Ordered: Current medicines are reviewed at length with the patient today.  Concerns regarding medicines are outlined above.   Tests Ordered: No orders of the defined types were placed in this encounter.   Medication Changes: No orders of the defined types were placed in this encounter.   Follow Up:  Either In Person or Virtual 1 yr  Signed, Kate Sable, MD  10/05/2019 10:18 AM    [place]

## 2019-10-05 NOTE — Telephone Encounter (Signed)
  Precert needed for:  Echo   Location:   [facility]  Date:  Oct 18, 2019

## 2019-10-18 ENCOUNTER — Ambulatory Visit (INDEPENDENT_AMBULATORY_CARE_PROVIDER_SITE_OTHER): Payer: Medicare Other

## 2019-10-18 ENCOUNTER — Other Ambulatory Visit: Payer: Self-pay

## 2019-10-18 DIAGNOSIS — I38 Endocarditis, valve unspecified: Secondary | ICD-10-CM | POA: Diagnosis not present

## 2019-10-22 ENCOUNTER — Telehealth: Payer: Self-pay | Admitting: *Deleted

## 2019-10-22 NOTE — Telephone Encounter (Signed)
-----   Message from Herminio Commons, MD sent at 10/19/2019  9:41 AM EST ----- Vigorous pumping function.  No valve problems.

## 2019-10-22 NOTE — Telephone Encounter (Signed)
Todd Nelson, [place]  [id number] AM EST    Patient notified. Copy to pmd.

## 2019-10-26 DIAGNOSIS — Z0000 Encounter for general adult medical examination without abnormal findings: Secondary | ICD-10-CM | POA: Diagnosis not present

## 2019-10-26 DIAGNOSIS — Z299 Encounter for prophylactic measures, unspecified: Secondary | ICD-10-CM | POA: Diagnosis not present

## 2019-10-26 DIAGNOSIS — Z7189 Other specified counseling: Secondary | ICD-10-CM | POA: Diagnosis not present

## 2019-10-26 DIAGNOSIS — I10 Essential (primary) hypertension: Secondary | ICD-10-CM | POA: Diagnosis not present

## 2019-10-26 DIAGNOSIS — Z125 Encounter for screening for malignant neoplasm of prostate: Secondary | ICD-10-CM | POA: Diagnosis not present

## 2019-10-26 DIAGNOSIS — Z1331 Encounter for screening for depression: Secondary | ICD-10-CM | POA: Diagnosis not present

## 2019-10-26 DIAGNOSIS — R5383 Other fatigue: Secondary | ICD-10-CM | POA: Diagnosis not present

## 2019-10-26 DIAGNOSIS — Z6830 Body mass index (BMI) 30.0-30.9, adult: Secondary | ICD-10-CM | POA: Diagnosis not present

## 2019-10-26 DIAGNOSIS — Z79899 Other long term (current) drug therapy: Secondary | ICD-10-CM | POA: Diagnosis not present

## 2019-10-26 DIAGNOSIS — Z1211 Encounter for screening for malignant neoplasm of colon: Secondary | ICD-10-CM | POA: Diagnosis not present

## 2019-10-26 DIAGNOSIS — Z1339 Encounter for screening examination for other mental health and behavioral disorders: Secondary | ICD-10-CM | POA: Diagnosis not present

## 2019-10-26 DIAGNOSIS — E7800 Pure hypercholesterolemia, unspecified: Secondary | ICD-10-CM | POA: Diagnosis not present

## 2019-10-26 DIAGNOSIS — I471 Supraventricular tachycardia: Secondary | ICD-10-CM | POA: Diagnosis not present

## 2019-10-31 DIAGNOSIS — Z713 Dietary counseling and surveillance: Secondary | ICD-10-CM | POA: Diagnosis not present

## 2019-10-31 DIAGNOSIS — R7309 Other abnormal glucose: Secondary | ICD-10-CM | POA: Diagnosis not present

## 2019-10-31 DIAGNOSIS — Z299 Encounter for prophylactic measures, unspecified: Secondary | ICD-10-CM | POA: Diagnosis not present

## 2019-10-31 DIAGNOSIS — Z6830 Body mass index (BMI) 30.0-30.9, adult: Secondary | ICD-10-CM | POA: Diagnosis not present

## 2019-10-31 DIAGNOSIS — I10 Essential (primary) hypertension: Secondary | ICD-10-CM | POA: Diagnosis not present

## 2019-11-21 DIAGNOSIS — Z23 Encounter for immunization: Secondary | ICD-10-CM | POA: Diagnosis not present

## 2019-12-19 DIAGNOSIS — Z23 Encounter for immunization: Secondary | ICD-10-CM | POA: Diagnosis not present

## 2020-01-30 DIAGNOSIS — Z299 Encounter for prophylactic measures, unspecified: Secondary | ICD-10-CM | POA: Diagnosis not present

## 2020-01-30 DIAGNOSIS — I471 Supraventricular tachycardia: Secondary | ICD-10-CM | POA: Diagnosis not present

## 2020-01-30 DIAGNOSIS — I776 Arteritis, unspecified: Secondary | ICD-10-CM | POA: Diagnosis not present

## 2020-01-30 DIAGNOSIS — I10 Essential (primary) hypertension: Secondary | ICD-10-CM | POA: Diagnosis not present

## 2020-05-01 DIAGNOSIS — M5136 Other intervertebral disc degeneration, lumbar region: Secondary | ICD-10-CM | POA: Diagnosis not present

## 2020-05-01 DIAGNOSIS — M79604 Pain in right leg: Secondary | ICD-10-CM | POA: Diagnosis not present

## 2020-05-01 DIAGNOSIS — I471 Supraventricular tachycardia: Secondary | ICD-10-CM | POA: Diagnosis not present

## 2020-05-01 DIAGNOSIS — I10 Essential (primary) hypertension: Secondary | ICD-10-CM | POA: Diagnosis not present

## 2020-05-01 DIAGNOSIS — M79605 Pain in left leg: Secondary | ICD-10-CM | POA: Diagnosis not present

## 2020-05-01 DIAGNOSIS — M545 Low back pain: Secondary | ICD-10-CM | POA: Diagnosis not present

## 2020-05-01 DIAGNOSIS — Z299 Encounter for prophylactic measures, unspecified: Secondary | ICD-10-CM | POA: Diagnosis not present

## 2020-05-01 DIAGNOSIS — M778 Other enthesopathies, not elsewhere classified: Secondary | ICD-10-CM | POA: Diagnosis not present

## 2020-05-13 DIAGNOSIS — M9902 Segmental and somatic dysfunction of thoracic region: Secondary | ICD-10-CM | POA: Diagnosis not present

## 2020-05-13 DIAGNOSIS — S233XXA Sprain of ligaments of thoracic spine, initial encounter: Secondary | ICD-10-CM | POA: Diagnosis not present

## 2020-05-13 DIAGNOSIS — M9903 Segmental and somatic dysfunction of lumbar region: Secondary | ICD-10-CM | POA: Diagnosis not present

## 2020-05-13 DIAGNOSIS — M47816 Spondylosis without myelopathy or radiculopathy, lumbar region: Secondary | ICD-10-CM | POA: Diagnosis not present

## 2020-05-13 DIAGNOSIS — M9901 Segmental and somatic dysfunction of cervical region: Secondary | ICD-10-CM | POA: Diagnosis not present

## 2020-05-13 DIAGNOSIS — S134XXA Sprain of ligaments of cervical spine, initial encounter: Secondary | ICD-10-CM | POA: Diagnosis not present

## 2020-05-15 DIAGNOSIS — M9902 Segmental and somatic dysfunction of thoracic region: Secondary | ICD-10-CM | POA: Diagnosis not present

## 2020-05-15 DIAGNOSIS — M9903 Segmental and somatic dysfunction of lumbar region: Secondary | ICD-10-CM | POA: Diagnosis not present

## 2020-05-15 DIAGNOSIS — S233XXA Sprain of ligaments of thoracic spine, initial encounter: Secondary | ICD-10-CM | POA: Diagnosis not present

## 2020-05-15 DIAGNOSIS — M47816 Spondylosis without myelopathy or radiculopathy, lumbar region: Secondary | ICD-10-CM | POA: Diagnosis not present

## 2020-05-15 DIAGNOSIS — M9901 Segmental and somatic dysfunction of cervical region: Secondary | ICD-10-CM | POA: Diagnosis not present

## 2020-05-15 DIAGNOSIS — S134XXA Sprain of ligaments of cervical spine, initial encounter: Secondary | ICD-10-CM | POA: Diagnosis not present

## 2020-05-21 DIAGNOSIS — M9903 Segmental and somatic dysfunction of lumbar region: Secondary | ICD-10-CM | POA: Diagnosis not present

## 2020-05-21 DIAGNOSIS — M9902 Segmental and somatic dysfunction of thoracic region: Secondary | ICD-10-CM | POA: Diagnosis not present

## 2020-05-21 DIAGNOSIS — M9901 Segmental and somatic dysfunction of cervical region: Secondary | ICD-10-CM | POA: Diagnosis not present

## 2020-05-21 DIAGNOSIS — S134XXA Sprain of ligaments of cervical spine, initial encounter: Secondary | ICD-10-CM | POA: Diagnosis not present

## 2020-05-21 DIAGNOSIS — S233XXA Sprain of ligaments of thoracic spine, initial encounter: Secondary | ICD-10-CM | POA: Diagnosis not present

## 2020-05-21 DIAGNOSIS — M47816 Spondylosis without myelopathy or radiculopathy, lumbar region: Secondary | ICD-10-CM | POA: Diagnosis not present

## 2020-06-03 DIAGNOSIS — S233XXA Sprain of ligaments of thoracic spine, initial encounter: Secondary | ICD-10-CM | POA: Diagnosis not present

## 2020-06-03 DIAGNOSIS — S134XXA Sprain of ligaments of cervical spine, initial encounter: Secondary | ICD-10-CM | POA: Diagnosis not present

## 2020-06-03 DIAGNOSIS — M9903 Segmental and somatic dysfunction of lumbar region: Secondary | ICD-10-CM | POA: Diagnosis not present

## 2020-06-03 DIAGNOSIS — M9901 Segmental and somatic dysfunction of cervical region: Secondary | ICD-10-CM | POA: Diagnosis not present

## 2020-06-03 DIAGNOSIS — M47816 Spondylosis without myelopathy or radiculopathy, lumbar region: Secondary | ICD-10-CM | POA: Diagnosis not present

## 2020-06-03 DIAGNOSIS — M9902 Segmental and somatic dysfunction of thoracic region: Secondary | ICD-10-CM | POA: Diagnosis not present

## 2020-06-10 DIAGNOSIS — M9903 Segmental and somatic dysfunction of lumbar region: Secondary | ICD-10-CM | POA: Diagnosis not present

## 2020-06-10 DIAGNOSIS — M47816 Spondylosis without myelopathy or radiculopathy, lumbar region: Secondary | ICD-10-CM | POA: Diagnosis not present

## 2020-06-10 DIAGNOSIS — M9902 Segmental and somatic dysfunction of thoracic region: Secondary | ICD-10-CM | POA: Diagnosis not present

## 2020-06-10 DIAGNOSIS — M9901 Segmental and somatic dysfunction of cervical region: Secondary | ICD-10-CM | POA: Diagnosis not present

## 2020-06-10 DIAGNOSIS — S233XXA Sprain of ligaments of thoracic spine, initial encounter: Secondary | ICD-10-CM | POA: Diagnosis not present

## 2020-06-10 DIAGNOSIS — S134XXA Sprain of ligaments of cervical spine, initial encounter: Secondary | ICD-10-CM | POA: Diagnosis not present

## 2020-06-17 DIAGNOSIS — M9902 Segmental and somatic dysfunction of thoracic region: Secondary | ICD-10-CM | POA: Diagnosis not present

## 2020-06-17 DIAGNOSIS — M9903 Segmental and somatic dysfunction of lumbar region: Secondary | ICD-10-CM | POA: Diagnosis not present

## 2020-06-17 DIAGNOSIS — S134XXA Sprain of ligaments of cervical spine, initial encounter: Secondary | ICD-10-CM | POA: Diagnosis not present

## 2020-06-17 DIAGNOSIS — S233XXA Sprain of ligaments of thoracic spine, initial encounter: Secondary | ICD-10-CM | POA: Diagnosis not present

## 2020-06-17 DIAGNOSIS — M47816 Spondylosis without myelopathy or radiculopathy, lumbar region: Secondary | ICD-10-CM | POA: Diagnosis not present

## 2020-06-17 DIAGNOSIS — M9901 Segmental and somatic dysfunction of cervical region: Secondary | ICD-10-CM | POA: Diagnosis not present

## 2020-07-03 DIAGNOSIS — M47816 Spondylosis without myelopathy or radiculopathy, lumbar region: Secondary | ICD-10-CM | POA: Diagnosis not present

## 2020-07-03 DIAGNOSIS — S233XXA Sprain of ligaments of thoracic spine, initial encounter: Secondary | ICD-10-CM | POA: Diagnosis not present

## 2020-07-03 DIAGNOSIS — M9902 Segmental and somatic dysfunction of thoracic region: Secondary | ICD-10-CM | POA: Diagnosis not present

## 2020-07-03 DIAGNOSIS — M9903 Segmental and somatic dysfunction of lumbar region: Secondary | ICD-10-CM | POA: Diagnosis not present

## 2020-07-03 DIAGNOSIS — S134XXA Sprain of ligaments of cervical spine, initial encounter: Secondary | ICD-10-CM | POA: Diagnosis not present

## 2020-07-03 DIAGNOSIS — M9901 Segmental and somatic dysfunction of cervical region: Secondary | ICD-10-CM | POA: Diagnosis not present

## 2020-07-17 DIAGNOSIS — M9903 Segmental and somatic dysfunction of lumbar region: Secondary | ICD-10-CM | POA: Diagnosis not present

## 2020-07-17 DIAGNOSIS — M9902 Segmental and somatic dysfunction of thoracic region: Secondary | ICD-10-CM | POA: Diagnosis not present

## 2020-07-17 DIAGNOSIS — S233XXA Sprain of ligaments of thoracic spine, initial encounter: Secondary | ICD-10-CM | POA: Diagnosis not present

## 2020-07-17 DIAGNOSIS — M9901 Segmental and somatic dysfunction of cervical region: Secondary | ICD-10-CM | POA: Diagnosis not present

## 2020-07-17 DIAGNOSIS — M47816 Spondylosis without myelopathy or radiculopathy, lumbar region: Secondary | ICD-10-CM | POA: Diagnosis not present

## 2020-07-17 DIAGNOSIS — S134XXA Sprain of ligaments of cervical spine, initial encounter: Secondary | ICD-10-CM | POA: Diagnosis not present

## 2020-07-31 DIAGNOSIS — M47816 Spondylosis without myelopathy or radiculopathy, lumbar region: Secondary | ICD-10-CM | POA: Diagnosis not present

## 2020-07-31 DIAGNOSIS — M9902 Segmental and somatic dysfunction of thoracic region: Secondary | ICD-10-CM | POA: Diagnosis not present

## 2020-07-31 DIAGNOSIS — M9901 Segmental and somatic dysfunction of cervical region: Secondary | ICD-10-CM | POA: Diagnosis not present

## 2020-07-31 DIAGNOSIS — S134XXA Sprain of ligaments of cervical spine, initial encounter: Secondary | ICD-10-CM | POA: Diagnosis not present

## 2020-07-31 DIAGNOSIS — M9903 Segmental and somatic dysfunction of lumbar region: Secondary | ICD-10-CM | POA: Diagnosis not present

## 2020-07-31 DIAGNOSIS — S233XXA Sprain of ligaments of thoracic spine, initial encounter: Secondary | ICD-10-CM | POA: Diagnosis not present

## 2020-08-06 DIAGNOSIS — I471 Supraventricular tachycardia: Secondary | ICD-10-CM | POA: Diagnosis not present

## 2020-08-06 DIAGNOSIS — Z23 Encounter for immunization: Secondary | ICD-10-CM | POA: Diagnosis not present

## 2020-08-06 DIAGNOSIS — Z6832 Body mass index (BMI) 32.0-32.9, adult: Secondary | ICD-10-CM | POA: Diagnosis not present

## 2020-08-06 DIAGNOSIS — I10 Essential (primary) hypertension: Secondary | ICD-10-CM | POA: Diagnosis not present

## 2020-08-06 DIAGNOSIS — I776 Arteritis, unspecified: Secondary | ICD-10-CM | POA: Diagnosis not present

## 2020-08-06 DIAGNOSIS — Z299 Encounter for prophylactic measures, unspecified: Secondary | ICD-10-CM | POA: Diagnosis not present

## 2020-08-14 DIAGNOSIS — S233XXA Sprain of ligaments of thoracic spine, initial encounter: Secondary | ICD-10-CM | POA: Diagnosis not present

## 2020-08-14 DIAGNOSIS — M47816 Spondylosis without myelopathy or radiculopathy, lumbar region: Secondary | ICD-10-CM | POA: Diagnosis not present

## 2020-08-14 DIAGNOSIS — Z23 Encounter for immunization: Secondary | ICD-10-CM | POA: Diagnosis not present

## 2020-08-14 DIAGNOSIS — M9902 Segmental and somatic dysfunction of thoracic region: Secondary | ICD-10-CM | POA: Diagnosis not present

## 2020-08-14 DIAGNOSIS — M9903 Segmental and somatic dysfunction of lumbar region: Secondary | ICD-10-CM | POA: Diagnosis not present

## 2020-08-14 DIAGNOSIS — M9901 Segmental and somatic dysfunction of cervical region: Secondary | ICD-10-CM | POA: Diagnosis not present

## 2020-08-14 DIAGNOSIS — S134XXA Sprain of ligaments of cervical spine, initial encounter: Secondary | ICD-10-CM | POA: Diagnosis not present

## 2020-08-27 DIAGNOSIS — M9902 Segmental and somatic dysfunction of thoracic region: Secondary | ICD-10-CM | POA: Diagnosis not present

## 2020-08-27 DIAGNOSIS — S134XXA Sprain of ligaments of cervical spine, initial encounter: Secondary | ICD-10-CM | POA: Diagnosis not present

## 2020-08-27 DIAGNOSIS — M9901 Segmental and somatic dysfunction of cervical region: Secondary | ICD-10-CM | POA: Diagnosis not present

## 2020-08-27 DIAGNOSIS — M47816 Spondylosis without myelopathy or radiculopathy, lumbar region: Secondary | ICD-10-CM | POA: Diagnosis not present

## 2020-08-27 DIAGNOSIS — S233XXA Sprain of ligaments of thoracic spine, initial encounter: Secondary | ICD-10-CM | POA: Diagnosis not present

## 2020-08-27 DIAGNOSIS — M9903 Segmental and somatic dysfunction of lumbar region: Secondary | ICD-10-CM | POA: Diagnosis not present

## 2020-09-10 DIAGNOSIS — S233XXA Sprain of ligaments of thoracic spine, initial encounter: Secondary | ICD-10-CM | POA: Diagnosis not present

## 2020-09-10 DIAGNOSIS — M9903 Segmental and somatic dysfunction of lumbar region: Secondary | ICD-10-CM | POA: Diagnosis not present

## 2020-09-10 DIAGNOSIS — S134XXA Sprain of ligaments of cervical spine, initial encounter: Secondary | ICD-10-CM | POA: Diagnosis not present

## 2020-09-10 DIAGNOSIS — M9901 Segmental and somatic dysfunction of cervical region: Secondary | ICD-10-CM | POA: Diagnosis not present

## 2020-09-10 DIAGNOSIS — M47816 Spondylosis without myelopathy or radiculopathy, lumbar region: Secondary | ICD-10-CM | POA: Diagnosis not present

## 2020-09-10 DIAGNOSIS — M9902 Segmental and somatic dysfunction of thoracic region: Secondary | ICD-10-CM | POA: Diagnosis not present

## 2020-09-24 DIAGNOSIS — M9901 Segmental and somatic dysfunction of cervical region: Secondary | ICD-10-CM | POA: Diagnosis not present

## 2020-09-24 DIAGNOSIS — S134XXA Sprain of ligaments of cervical spine, initial encounter: Secondary | ICD-10-CM | POA: Diagnosis not present

## 2020-09-24 DIAGNOSIS — M9903 Segmental and somatic dysfunction of lumbar region: Secondary | ICD-10-CM | POA: Diagnosis not present

## 2020-09-24 DIAGNOSIS — M47816 Spondylosis without myelopathy or radiculopathy, lumbar region: Secondary | ICD-10-CM | POA: Diagnosis not present

## 2020-09-24 DIAGNOSIS — M9902 Segmental and somatic dysfunction of thoracic region: Secondary | ICD-10-CM | POA: Diagnosis not present

## 2020-09-24 DIAGNOSIS — S233XXA Sprain of ligaments of thoracic spine, initial encounter: Secondary | ICD-10-CM | POA: Diagnosis not present

## 2020-10-08 DIAGNOSIS — S134XXA Sprain of ligaments of cervical spine, initial encounter: Secondary | ICD-10-CM | POA: Diagnosis not present

## 2020-10-08 DIAGNOSIS — S233XXA Sprain of ligaments of thoracic spine, initial encounter: Secondary | ICD-10-CM | POA: Diagnosis not present

## 2020-10-08 DIAGNOSIS — M9903 Segmental and somatic dysfunction of lumbar region: Secondary | ICD-10-CM | POA: Diagnosis not present

## 2020-10-08 DIAGNOSIS — M9901 Segmental and somatic dysfunction of cervical region: Secondary | ICD-10-CM | POA: Diagnosis not present

## 2020-10-08 DIAGNOSIS — M9902 Segmental and somatic dysfunction of thoracic region: Secondary | ICD-10-CM | POA: Diagnosis not present

## 2020-10-08 DIAGNOSIS — M47816 Spondylosis without myelopathy or radiculopathy, lumbar region: Secondary | ICD-10-CM | POA: Diagnosis not present

## 2020-10-22 DIAGNOSIS — S134XXA Sprain of ligaments of cervical spine, initial encounter: Secondary | ICD-10-CM | POA: Diagnosis not present

## 2020-10-22 DIAGNOSIS — M9901 Segmental and somatic dysfunction of cervical region: Secondary | ICD-10-CM | POA: Diagnosis not present

## 2020-10-22 DIAGNOSIS — M47816 Spondylosis without myelopathy or radiculopathy, lumbar region: Secondary | ICD-10-CM | POA: Diagnosis not present

## 2020-10-22 DIAGNOSIS — M9902 Segmental and somatic dysfunction of thoracic region: Secondary | ICD-10-CM | POA: Diagnosis not present

## 2020-10-22 DIAGNOSIS — S233XXA Sprain of ligaments of thoracic spine, initial encounter: Secondary | ICD-10-CM | POA: Diagnosis not present

## 2020-10-22 DIAGNOSIS — M9903 Segmental and somatic dysfunction of lumbar region: Secondary | ICD-10-CM | POA: Diagnosis not present

## 2020-10-31 DIAGNOSIS — I10 Essential (primary) hypertension: Secondary | ICD-10-CM | POA: Diagnosis not present

## 2020-10-31 DIAGNOSIS — Z6831 Body mass index (BMI) 31.0-31.9, adult: Secondary | ICD-10-CM | POA: Diagnosis not present

## 2020-10-31 DIAGNOSIS — Z1339 Encounter for screening examination for other mental health and behavioral disorders: Secondary | ICD-10-CM | POA: Diagnosis not present

## 2020-10-31 DIAGNOSIS — Z79899 Other long term (current) drug therapy: Secondary | ICD-10-CM | POA: Diagnosis not present

## 2020-10-31 DIAGNOSIS — Z125 Encounter for screening for malignant neoplasm of prostate: Secondary | ICD-10-CM | POA: Diagnosis not present

## 2020-10-31 DIAGNOSIS — I471 Supraventricular tachycardia: Secondary | ICD-10-CM | POA: Diagnosis not present

## 2020-10-31 DIAGNOSIS — Z299 Encounter for prophylactic measures, unspecified: Secondary | ICD-10-CM | POA: Diagnosis not present

## 2020-10-31 DIAGNOSIS — E7800 Pure hypercholesterolemia, unspecified: Secondary | ICD-10-CM | POA: Diagnosis not present

## 2020-10-31 DIAGNOSIS — Z0000 Encounter for general adult medical examination without abnormal findings: Secondary | ICD-10-CM | POA: Diagnosis not present

## 2020-10-31 DIAGNOSIS — Z1331 Encounter for screening for depression: Secondary | ICD-10-CM | POA: Diagnosis not present

## 2020-10-31 DIAGNOSIS — Z7189 Other specified counseling: Secondary | ICD-10-CM | POA: Diagnosis not present

## 2020-10-31 DIAGNOSIS — Z87891 Personal history of nicotine dependence: Secondary | ICD-10-CM | POA: Diagnosis not present

## 2020-10-31 DIAGNOSIS — R5383 Other fatigue: Secondary | ICD-10-CM | POA: Diagnosis not present

## 2020-11-07 ENCOUNTER — Encounter: Payer: Self-pay | Admitting: Cardiology

## 2020-11-07 ENCOUNTER — Ambulatory Visit (INDEPENDENT_AMBULATORY_CARE_PROVIDER_SITE_OTHER): Payer: Medicare Other | Admitting: Cardiology

## 2020-11-07 ENCOUNTER — Telehealth: Payer: Self-pay | Admitting: Cardiology

## 2020-11-07 VITALS — BP 142/58 | HR 69 | Ht 70.0 in | Wt 191.0 lb

## 2020-11-07 DIAGNOSIS — I25119 Atherosclerotic heart disease of native coronary artery with unspecified angina pectoris: Secondary | ICD-10-CM | POA: Diagnosis not present

## 2020-11-07 DIAGNOSIS — E782 Mixed hyperlipidemia: Secondary | ICD-10-CM | POA: Diagnosis not present

## 2020-11-07 NOTE — Progress Notes (Signed)
Cardiology Office Note  Date: 11/07/2020   ID: Todd Nelson, Todd Nelson 09-Jan-1941, MRN [id number]  PCP:  Glenda Chroman, MD  Cardiologist:  Rozann Lesches, MD Electrophysiologist:  None   Chief Complaint  Patient presents with  . Cardiac follow-up    History of Present Illness: Todd Nelson is a 80 y.o. male former patient of Dr. Bronson Ing now presenting to establish follow-up with me.  I reviewed his records and updated the chart.  He was last assessed via telehealth encounter in January 2021.  He states that he has been doing well overall.  He does feel some degree of chest tightness when he "overdoes it", but otherwise has been able to walk for exercise and use an elliptical machine when the weather is not good.  He just recently had a visit with Dr. Woody Seller including lab work which we are requesting for review.  I reviewed his medications which are outlined below.  He reports compliance and no obvious intolerances.  Today's ECG shows normal sinus rhythm.  I reviewed his remote cardiac catheterization report from 2002.  He did undergo a follow-up exercise Myoview at [facility] back in 2011 that was low risk.  No interval ischemic testing.  Past Medical History:  Diagnosis Date  . Arthritis   . Barrett esophagus 01/2008  . CAD (coronary artery disease)    Mild to moderate nonobstructive disease 2002  . Essential hypertension   . GERD (gastroesophageal reflux disease)   . Hemorrhoids, external   . Hyperlipidemia   . Osteoarthritis   . Schatzki's ring 01/2008   Status post dilatation  . Umbilical hernia     Past Surgical History:  Procedure Laterality Date  . APPENDECTOMY    . BIOPSY  06/27/2019   Procedure: BIOPSY;  Surgeon: Daneil Dolin, MD;  [facility];  Service: Endoscopy;;  esophagus  . COLONOSCOPY  2005   hyperplastic polyps,hemorrhoids  . COLONOSCOPY N/A 02/04/2014   Rourk: normal. consider one more screening in 10 years  . ESOPHAGOGASTRODUODENOSCOPY   02/06/08   RMR: short-segment Barrett's, Schatzki's ring s/p dilation   . ESOPHAGOGASTRODUODENOSCOPY  July 2010   RMR: short-segment Barrett's  . ESOPHAGOGASTRODUODENOSCOPY N/A 03/02/2016   Fundic gland polyps, Barrett's without dysplasia. Next EGD June 2020.  Marland Kitchen ESOPHAGOGASTRODUODENOSCOPY N/A 06/27/2019   Procedure: ESOPHAGOGASTRODUODENOSCOPY (EGD);  Surgeon: Daneil Dolin, MD;  [facility];  Service: Endoscopy;  Laterality: N/A;  9:30am  . ESOPHAGOGASTRODUODENOSCOPY (EGD) WITH ESOPHAGEAL DILATION  08/21/2012   RMR: noncritical Schatzki ring, small hh, short-segment Barretts with no dysplasia.H.pylori gastritis  . KNEE SURGERY  2011   both knees-Bil knee replacement  . NECK SURGERY  2008   plate  . TONSILLECTOMY      Current Outpatient Medications  Medication Sig Dispense Refill  . amLODipine (NORVASC) 10 MG tablet Take 10 mg by mouth daily.    Marland Kitchen aspirin 81 MG tablet Take 81 mg by mouth daily.    . fexofenadine (ALLEGRA) 180 MG tablet Take 180 mg by mouth daily as needed for allergies.    . fish oil-omega-3 fatty acids 1000 MG capsule Take 1 g by mouth daily.    . irbesartan-hydrochlorothiazide (AVALIDE) 150-12.5 MG tablet Take 1 tablet by mouth daily.    . meloxicam (MOBIC) 7.5 MG tablet Take 7.5 mg by mouth daily.    . metoprolol succinate (TOPROL-XL) 25 MG 24 hr tablet Take 25 mg by mouth daily.    Marland Kitchen omeprazole (PRILOSEC) 20 MG capsule  Take 20 mg by mouth daily.    . simvastatin (ZOCOR) 20 MG tablet Take 20 mg by mouth at bedtime.     No current facility-administered medications for this visit.   Allergies:  Patient has no known allergies.   ROS: No palpitations or syncope.  Physical Exam: VS:  BP (!) 142/58   Pulse 69   Ht 5\' 10"  (1.778 m)   Wt 191 lb (86.6 kg)   SpO2 97%   BMI 27.41 kg/m , BMI Body mass index is 27.41 kg/m.  Wt Readings from Last 3 Encounters:  11/07/20 191 lb (86.6 kg)  10/05/19 179 lb (81.2 kg)  09/14/19 180 lb (81.6 kg)    General:  Patient appears comfortable at rest. HEENT: Conjunctiva and lids normal, wearing a mask. Neck: Supple, no elevated JVP or carotid bruits, no thyromegaly. Lungs: Clear to auscultation, nonlabored breathing at rest. Cardiac: Regular rate and rhythm, no S3 or significant systolic murmur, no pericardial rub. Abdomen: Soft, bowel sounds present. Extremities: No pitting edema.  ECG:  An ECG dated 09/14/2019 was personally reviewed today and demonstrated:  Sinus rhythm with PVC and lead artifact.  Recent Labwork:  No interval lab work for review today.  Other Studies Reviewed Today:  Cardiac catheterization 08/10/2001: HEMODYNAMICS: Left ventricular pressure 130/16. Aortic pressure 120/78.  There was minimal to no outflow tract gradient on catheter pullback across the aortic valve.  LEFT VENTRICULOGRAM: The left ventricle is hyperdynamic with no focal wall motion abnormalities. Ejection fraction is estimated at greater than 65%. There is no mitral regurgitation.  ABDOMINAL AORTOGRAM: Abdominal aortogram reveals minor irregularities and the abdominal aorta and iliac arteries were patent, renal arteries bilaterally.  CORONARY ARTERIOGRAPHY: (Right dominant).  Left main: Left main has an ostial 30-40% stenosis.  Left anterior descending: The left anterior descending artery has a 30% stenosis in the proximal vessel, 20% in the mid vessel. There is a large first diagonal branch and a smaller second diagonal branch arising from the LAD.  Left circumflex: The left circumflex has a 50% stenosis in the proximal vessel and a very sharp bend. The circumflex gives rise to a small to normal sized ramus intermediate, small OM-1, small to normal sized OM-2 and small to normal sized OM-3.  Right coronary artery: The right coronary artery has a 20% stenosis in the proximal vessel, 20% in the mid vessel. The distal right coronary artery gives rise to a large posterior descending artery which  has a 20% stenosis at its origin. There is a normal sized posterolateral branch arising from the distal right coronary artery.  IMPRESSIONS: 1. Normal to hyperdynamic left ventricular systolic function with minimal to    no left ventricular outflow tract gradient. 2. Mild to moderate coronary artery disease as described involving the    ostial left main and the proximal left circumflex. However, neither of    these lesions appear to be hemodynamically significant.  Echocardiogram 10/18/2019: 1. Left ventricular ejection fraction, by visual estimation, is 70 to  75%. The left ventricle has hyperdynamic function. There is mildly  increased left ventricular hypertrophy.  2. Left ventricular diastolic parameters are consistent with Grade I  diastolic dysfunction (impaired relaxation).  3. The left ventricle has no regional wall motion abnormalities.  4. Turbulent LVOT flow without a significant gradient is created by a  hyperdynamic LV with basal septal hypertrophy and mild SAM.  5. Global right ventricle has normal systolic function.The right  ventricular size is normal. No increase in right ventricular  wall  thickness.  6. Left atrial size was normal.  7. Right atrial size was normal.  8. Mild mitral annular calcification.  9. The mitral valve is grossly normal. Trivial mitral valve  regurgitation. No evidence of mitral stenosis.  10. The tricuspid valve is normal in structure.  11. The tricuspid valve is normal in structure. Tricuspid valve  regurgitation is trivial.  12. The aortic valve is tricuspid. Aortic valve regurgitation is not  visualized. No evidence of aortic valve sclerosis or stenosis.  13. The pulmonic valve was not well visualized. Pulmonic valve  regurgitation is not visualized.  14. Mildly elevated pulmonary artery systolic pressure.   Assessment and Plan:  1.  Mild to moderate CAD by cardiac catheterization in 2002.  He has done well on medical therapy  for quite some time.  ECG is normal today.  Last formal ischemic evaluation was approximately 10 years ago.  He does report angina symptoms at highest level of activity.  We will plan a follow-up exercise Myoview (hold beta-blocker for test) to reevaluate ischemic burden.  For now continue aspirin, Norvasc, Avalide, Toprol-XL, and Zocor.  2.  Mixed hyperlipidemia on Zocor.  Requesting recent lab work from PCP.  Medication Adjustments/Labs and Tests Ordered: Current medicines are reviewed at length with the patient today.  Concerns regarding medicines are outlined above.   Tests Ordered: Orders Placed This Encounter  Procedures  . NM Myocar Multi W/Spect W/Wall Motion / EF  . EKG 12-Lead    Medication Changes: No orders of the defined types were placed in this encounter.   Disposition:  Follow up 1 year in the [place] office.  Signed, Satira Sark, MD, [facility] 11/07/2020 11:16 AM    [place] [facility] [place], [place], [place] [address] Phone: [phone]; Fax: [phone]

## 2020-11-07 NOTE — Telephone Encounter (Signed)
Pre-cert Verification for the following procedure    EXERCISE MYOVIEW  DATE:  11/12/2020   LOCATION: [facility]

## 2020-11-07 NOTE — Patient Instructions (Addendum)
Medication Instructions:  Your physician recommends that you continue on your current medications as directed. Please refer to the Current Medication list given to you today.  *If you need a refill on your cardiac medications before your next appointment, please call your pharmacy*   Lab Work: None today If you have labs (blood work) drawn today and your tests are completely normal, you will receive your results only by: Marland Kitchen MyChart Message (if you have MyChart) OR . A paper copy in the mail If you have any lab test that is abnormal or we need to change your treatment, we will call you to review the results.   Testing/Procedures: Your physician has requested that you have en exercise stress myoview. For further information please visit [email or web address]. Please follow instruction sheet, as given.HOLD TOPROL THE MORNING OF TEST    Follow-Up: At [facility], you and your health needs are our priority.  As part of our continuing mission to provide you with exceptional heart care, we have created designated Provider Care Teams.  These Care Teams include your primary Cardiologist (physician) and Advanced Practice Providers (APPs -  Physician Assistants and Nurse Practitioners) who all work together to provide you with the care you need, when you need it.  We recommend signing up for the patient portal called "MyChart".  Sign up information is provided on this After Visit Summary.  MyChart is used to connect with patients for Virtual Visits (Telemedicine).  Patients are able to view lab/test results, encounter notes, upcoming appointments, etc.  Non-urgent messages can be sent to your provider as well.   To learn more about what you can do with MyChart, go to [email or web address].    Your next appointment:   12 month(s)  The format for your next appointment:   In Person  Provider:   Rozann Lesches, MD   Other Instructions None      Thank you for choosing [facility] !

## 2020-11-10 ENCOUNTER — Other Ambulatory Visit (HOSPITAL_COMMUNITY)
Admission: RE | Admit: 2020-11-10 | Discharge: 2020-11-10 | Disposition: A | Discharge status: Home / Self Care | Payer: Medicare Other | Source: Ambulatory Visit | Attending: Cardiology | Admitting: Cardiology

## 2020-11-10 ENCOUNTER — Other Ambulatory Visit: Payer: Self-pay

## 2020-11-10 DIAGNOSIS — Z01812 Encounter for preprocedural laboratory examination: Secondary | ICD-10-CM | POA: Diagnosis not present

## 2020-11-10 DIAGNOSIS — Z20822 Contact with and (suspected) exposure to covid-19: Secondary | ICD-10-CM | POA: Diagnosis not present

## 2020-11-10 LAB — SARS CORONAVIRUS 2 (TAT 6-24 HRS): SARS Coronavirus 2: NEGATIVE

## 2020-11-12 ENCOUNTER — Other Ambulatory Visit: Payer: Self-pay

## 2020-11-12 ENCOUNTER — Encounter (HOSPITAL_COMMUNITY)
Admission: RE | Admit: 2020-11-12 | Discharge: 2020-11-12 | Disposition: A | Discharge status: Home / Self Care | Payer: Medicare Other | Source: Ambulatory Visit | Attending: Cardiology | Admitting: Cardiology

## 2020-11-12 ENCOUNTER — Encounter (HOSPITAL_BASED_OUTPATIENT_CLINIC_OR_DEPARTMENT_OTHER)
Admission: RE | Admit: 2020-11-12 | Discharge: 2020-11-12 | Disposition: A | Discharge status: Home / Self Care | Payer: Medicare Other | Source: Ambulatory Visit | Attending: Cardiology | Admitting: Cardiology

## 2020-11-12 DIAGNOSIS — I25119 Atherosclerotic heart disease of native coronary artery with unspecified angina pectoris: Secondary | ICD-10-CM | POA: Insufficient documentation

## 2020-11-12 LAB — NM MYOCAR MULTI W/SPECT W/WALL MOTION / EF
Estimated workload: 4.6 METS
Exercise duration (min): 3 min
Exercise duration (sec): 0 s
LV dias vol: 72 mL (ref 62–150)
LV sys vol: 16 mL
MPHR: 142 {beats}/min
Peak HR: 129 {beats}/min
Percent HR: 90 %
RATE: 0.26
RPE: 11
Rest HR: 77 {beats}/min
SDS: 1
SRS: 0
SSS: 1
TID: 1.04

## 2020-11-12 MED ORDER — REGADENOSON 0.4 MG/5ML IV SOLN
INTRAVENOUS | Status: AC
  Filled 2020-11-12: qty 5

## 2020-11-12 MED ORDER — SODIUM CHLORIDE FLUSH 0.9 % IV SOLN
INTRAVENOUS | Status: AC
  Administered 2020-11-12: 10 mL via INTRAVENOUS
  Filled 2020-11-12: qty 10

## 2020-11-12 MED ORDER — TECHNETIUM TC 99M TETROFOSMIN IV KIT
30.0000 | PACK | Freq: Once | INTRAVENOUS | Status: AC | PRN
  Administered 2020-11-12: 30.4 via INTRAVENOUS

## 2020-11-12 MED ORDER — TECHNETIUM TC 99M TETROFOSMIN IV KIT
10.0000 | PACK | Freq: Once | INTRAVENOUS | Status: AC | PRN
  Administered 2020-11-12: 9.4 via INTRAVENOUS

## 2020-11-13 ENCOUNTER — Telehealth: Payer: Self-pay | Admitting: *Deleted

## 2020-11-13 DIAGNOSIS — M47816 Spondylosis without myelopathy or radiculopathy, lumbar region: Secondary | ICD-10-CM | POA: Diagnosis not present

## 2020-11-13 DIAGNOSIS — M9901 Segmental and somatic dysfunction of cervical region: Secondary | ICD-10-CM | POA: Diagnosis not present

## 2020-11-13 DIAGNOSIS — S233XXA Sprain of ligaments of thoracic spine, initial encounter: Secondary | ICD-10-CM | POA: Diagnosis not present

## 2020-11-13 DIAGNOSIS — S134XXA Sprain of ligaments of cervical spine, initial encounter: Secondary | ICD-10-CM | POA: Diagnosis not present

## 2020-11-13 DIAGNOSIS — M9903 Segmental and somatic dysfunction of lumbar region: Secondary | ICD-10-CM | POA: Diagnosis not present

## 2020-11-13 DIAGNOSIS — M9902 Segmental and somatic dysfunction of thoracic region: Secondary | ICD-10-CM | POA: Diagnosis not present

## 2020-11-13 NOTE — Telephone Encounter (Signed)
-----   Message from Satira Sark, MD sent at 11/12/2020  2:46 PM EST ----- Results reviewed.  Stress test was overall low risk as indicated.  Would suggest continuing medical therapy and keep follow-up plan as scheduled.

## 2020-11-13 NOTE — Telephone Encounter (Signed)
Patient informed. Copy sent to PCP °

## 2020-11-26 DIAGNOSIS — M47816 Spondylosis without myelopathy or radiculopathy, lumbar region: Secondary | ICD-10-CM | POA: Diagnosis not present

## 2020-11-26 DIAGNOSIS — S134XXA Sprain of ligaments of cervical spine, initial encounter: Secondary | ICD-10-CM | POA: Diagnosis not present

## 2020-11-26 DIAGNOSIS — M9901 Segmental and somatic dysfunction of cervical region: Secondary | ICD-10-CM | POA: Diagnosis not present

## 2020-11-26 DIAGNOSIS — M9902 Segmental and somatic dysfunction of thoracic region: Secondary | ICD-10-CM | POA: Diagnosis not present

## 2020-11-26 DIAGNOSIS — M9903 Segmental and somatic dysfunction of lumbar region: Secondary | ICD-10-CM | POA: Diagnosis not present

## 2020-11-26 DIAGNOSIS — S233XXA Sprain of ligaments of thoracic spine, initial encounter: Secondary | ICD-10-CM | POA: Diagnosis not present

## 2020-12-10 DIAGNOSIS — M9903 Segmental and somatic dysfunction of lumbar region: Secondary | ICD-10-CM | POA: Diagnosis not present

## 2020-12-10 DIAGNOSIS — M47816 Spondylosis without myelopathy or radiculopathy, lumbar region: Secondary | ICD-10-CM | POA: Diagnosis not present

## 2020-12-10 DIAGNOSIS — M9901 Segmental and somatic dysfunction of cervical region: Secondary | ICD-10-CM | POA: Diagnosis not present

## 2020-12-10 DIAGNOSIS — M9902 Segmental and somatic dysfunction of thoracic region: Secondary | ICD-10-CM | POA: Diagnosis not present

## 2020-12-10 DIAGNOSIS — S233XXA Sprain of ligaments of thoracic spine, initial encounter: Secondary | ICD-10-CM | POA: Diagnosis not present

## 2020-12-10 DIAGNOSIS — S134XXA Sprain of ligaments of cervical spine, initial encounter: Secondary | ICD-10-CM | POA: Diagnosis not present

## 2020-12-24 DIAGNOSIS — M9903 Segmental and somatic dysfunction of lumbar region: Secondary | ICD-10-CM | POA: Diagnosis not present

## 2020-12-24 DIAGNOSIS — M9901 Segmental and somatic dysfunction of cervical region: Secondary | ICD-10-CM | POA: Diagnosis not present

## 2020-12-24 DIAGNOSIS — M47816 Spondylosis without myelopathy or radiculopathy, lumbar region: Secondary | ICD-10-CM | POA: Diagnosis not present

## 2020-12-24 DIAGNOSIS — S233XXA Sprain of ligaments of thoracic spine, initial encounter: Secondary | ICD-10-CM | POA: Diagnosis not present

## 2020-12-24 DIAGNOSIS — M9902 Segmental and somatic dysfunction of thoracic region: Secondary | ICD-10-CM | POA: Diagnosis not present

## 2020-12-24 DIAGNOSIS — S134XXA Sprain of ligaments of cervical spine, initial encounter: Secondary | ICD-10-CM | POA: Diagnosis not present

## 2021-01-14 DIAGNOSIS — M25562 Pain in left knee: Secondary | ICD-10-CM | POA: Diagnosis not present

## 2021-01-14 DIAGNOSIS — M25569 Pain in unspecified knee: Secondary | ICD-10-CM | POA: Diagnosis not present

## 2021-01-14 DIAGNOSIS — Z96659 Presence of unspecified artificial knee joint: Secondary | ICD-10-CM | POA: Diagnosis not present

## 2021-01-19 DIAGNOSIS — S233XXA Sprain of ligaments of thoracic spine, initial encounter: Secondary | ICD-10-CM | POA: Diagnosis not present

## 2021-01-19 DIAGNOSIS — M9902 Segmental and somatic dysfunction of thoracic region: Secondary | ICD-10-CM | POA: Diagnosis not present

## 2021-01-19 DIAGNOSIS — S134XXA Sprain of ligaments of cervical spine, initial encounter: Secondary | ICD-10-CM | POA: Diagnosis not present

## 2021-01-19 DIAGNOSIS — M9903 Segmental and somatic dysfunction of lumbar region: Secondary | ICD-10-CM | POA: Diagnosis not present

## 2021-01-19 DIAGNOSIS — M47816 Spondylosis without myelopathy or radiculopathy, lumbar region: Secondary | ICD-10-CM | POA: Diagnosis not present

## 2021-01-19 DIAGNOSIS — M9901 Segmental and somatic dysfunction of cervical region: Secondary | ICD-10-CM | POA: Diagnosis not present

## 2021-01-22 DIAGNOSIS — M25562 Pain in left knee: Secondary | ICD-10-CM | POA: Diagnosis not present

## 2021-01-22 DIAGNOSIS — Z96652 Presence of left artificial knee joint: Secondary | ICD-10-CM | POA: Diagnosis not present

## 2021-01-27 DIAGNOSIS — B353 Tinea pedis: Secondary | ICD-10-CM | POA: Diagnosis not present

## 2021-01-27 DIAGNOSIS — I10 Essential (primary) hypertension: Secondary | ICD-10-CM | POA: Diagnosis not present

## 2021-01-27 DIAGNOSIS — I471 Supraventricular tachycardia: Secondary | ICD-10-CM | POA: Diagnosis not present

## 2021-01-27 DIAGNOSIS — Z299 Encounter for prophylactic measures, unspecified: Secondary | ICD-10-CM | POA: Diagnosis not present

## 2021-01-27 DIAGNOSIS — R21 Rash and other nonspecific skin eruption: Secondary | ICD-10-CM | POA: Diagnosis not present

## 2021-01-29 DIAGNOSIS — B353 Tinea pedis: Secondary | ICD-10-CM | POA: Diagnosis not present

## 2021-01-29 DIAGNOSIS — B354 Tinea corporis: Secondary | ICD-10-CM | POA: Diagnosis not present

## 2021-02-02 ENCOUNTER — Other Ambulatory Visit (HOSPITAL_COMMUNITY): Payer: Self-pay | Admitting: Orthopedic Surgery

## 2021-02-02 ENCOUNTER — Other Ambulatory Visit: Payer: Self-pay | Admitting: Orthopedic Surgery

## 2021-02-02 DIAGNOSIS — Z8269 Family history of other diseases of the musculoskeletal system and connective tissue: Secondary | ICD-10-CM | POA: Diagnosis not present

## 2021-02-02 DIAGNOSIS — Z96659 Presence of unspecified artificial knee joint: Secondary | ICD-10-CM | POA: Diagnosis not present

## 2021-02-02 DIAGNOSIS — Z136 Encounter for screening for cardiovascular disorders: Secondary | ICD-10-CM | POA: Diagnosis not present

## 2021-02-02 DIAGNOSIS — M25562 Pain in left knee: Secondary | ICD-10-CM | POA: Diagnosis not present

## 2021-02-05 ENCOUNTER — Other Ambulatory Visit: Payer: Self-pay

## 2021-02-05 ENCOUNTER — Encounter (HOSPITAL_COMMUNITY)
Admission: RE | Admit: 2021-02-05 | Discharge: 2021-02-05 | Disposition: A | Discharge status: Home / Self Care | Payer: Medicare Other | Source: Ambulatory Visit | Attending: Orthopedic Surgery | Admitting: Orthopedic Surgery

## 2021-02-05 DIAGNOSIS — Z96659 Presence of unspecified artificial knee joint: Secondary | ICD-10-CM | POA: Insufficient documentation

## 2021-02-05 DIAGNOSIS — M25561 Pain in right knee: Secondary | ICD-10-CM | POA: Diagnosis not present

## 2021-02-05 DIAGNOSIS — M25562 Pain in left knee: Secondary | ICD-10-CM | POA: Diagnosis not present

## 2021-02-05 DIAGNOSIS — Z96653 Presence of artificial knee joint, bilateral: Secondary | ICD-10-CM | POA: Diagnosis not present

## 2021-02-05 MED ORDER — TECHNETIUM TC 99M MEDRONATE IV KIT
20.0000 | PACK | Freq: Once | INTRAVENOUS | Status: AC | PRN
  Administered 2021-02-05: 19.1 via INTRAVENOUS

## 2021-02-06 ENCOUNTER — Ambulatory Visit (HOSPITAL_COMMUNITY): Payer: Medicare Other

## 2021-02-06 ENCOUNTER — Other Ambulatory Visit (HOSPITAL_COMMUNITY): Payer: Medicare Other

## 2021-02-11 DIAGNOSIS — M25562 Pain in left knee: Secondary | ICD-10-CM | POA: Diagnosis not present

## 2021-02-11 DIAGNOSIS — Z96652 Presence of left artificial knee joint: Secondary | ICD-10-CM | POA: Diagnosis not present

## 2021-02-16 ENCOUNTER — Ambulatory Visit (HOSPITAL_COMMUNITY): Payer: Medicare Other

## 2021-02-16 ENCOUNTER — Other Ambulatory Visit (HOSPITAL_COMMUNITY): Payer: Medicare Other

## 2021-02-16 DIAGNOSIS — S134XXA Sprain of ligaments of cervical spine, initial encounter: Secondary | ICD-10-CM | POA: Diagnosis not present

## 2021-02-16 DIAGNOSIS — B353 Tinea pedis: Secondary | ICD-10-CM | POA: Diagnosis not present

## 2021-02-16 DIAGNOSIS — B354 Tinea corporis: Secondary | ICD-10-CM | POA: Diagnosis not present

## 2021-02-16 DIAGNOSIS — M9901 Segmental and somatic dysfunction of cervical region: Secondary | ICD-10-CM | POA: Diagnosis not present

## 2021-02-16 DIAGNOSIS — S233XXA Sprain of ligaments of thoracic spine, initial encounter: Secondary | ICD-10-CM | POA: Diagnosis not present

## 2021-02-16 DIAGNOSIS — M9902 Segmental and somatic dysfunction of thoracic region: Secondary | ICD-10-CM | POA: Diagnosis not present

## 2021-02-16 DIAGNOSIS — Z1283 Encounter for screening for malignant neoplasm of skin: Secondary | ICD-10-CM | POA: Diagnosis not present

## 2021-02-16 DIAGNOSIS — L570 Actinic keratosis: Secondary | ICD-10-CM | POA: Diagnosis not present

## 2021-02-16 DIAGNOSIS — M9903 Segmental and somatic dysfunction of lumbar region: Secondary | ICD-10-CM | POA: Diagnosis not present

## 2021-02-16 DIAGNOSIS — M47816 Spondylosis without myelopathy or radiculopathy, lumbar region: Secondary | ICD-10-CM | POA: Diagnosis not present

## 2021-02-27 DIAGNOSIS — I10 Essential (primary) hypertension: Secondary | ICD-10-CM | POA: Diagnosis not present

## 2021-02-27 DIAGNOSIS — Z299 Encounter for prophylactic measures, unspecified: Secondary | ICD-10-CM | POA: Diagnosis not present

## 2021-02-27 DIAGNOSIS — D692 Other nonthrombocytopenic purpura: Secondary | ICD-10-CM | POA: Diagnosis not present

## 2021-03-02 DIAGNOSIS — S233XXA Sprain of ligaments of thoracic spine, initial encounter: Secondary | ICD-10-CM | POA: Diagnosis not present

## 2021-03-02 DIAGNOSIS — M47816 Spondylosis without myelopathy or radiculopathy, lumbar region: Secondary | ICD-10-CM | POA: Diagnosis not present

## 2021-03-02 DIAGNOSIS — S134XXA Sprain of ligaments of cervical spine, initial encounter: Secondary | ICD-10-CM | POA: Diagnosis not present

## 2021-03-02 DIAGNOSIS — M9902 Segmental and somatic dysfunction of thoracic region: Secondary | ICD-10-CM | POA: Diagnosis not present

## 2021-03-02 DIAGNOSIS — M9901 Segmental and somatic dysfunction of cervical region: Secondary | ICD-10-CM | POA: Diagnosis not present

## 2021-03-02 DIAGNOSIS — M9903 Segmental and somatic dysfunction of lumbar region: Secondary | ICD-10-CM | POA: Diagnosis not present

## 2021-03-16 DIAGNOSIS — M47816 Spondylosis without myelopathy or radiculopathy, lumbar region: Secondary | ICD-10-CM | POA: Diagnosis not present

## 2021-03-16 DIAGNOSIS — S233XXA Sprain of ligaments of thoracic spine, initial encounter: Secondary | ICD-10-CM | POA: Diagnosis not present

## 2021-03-16 DIAGNOSIS — M9901 Segmental and somatic dysfunction of cervical region: Secondary | ICD-10-CM | POA: Diagnosis not present

## 2021-03-16 DIAGNOSIS — S134XXA Sprain of ligaments of cervical spine, initial encounter: Secondary | ICD-10-CM | POA: Diagnosis not present

## 2021-03-16 DIAGNOSIS — M9903 Segmental and somatic dysfunction of lumbar region: Secondary | ICD-10-CM | POA: Diagnosis not present

## 2021-03-16 DIAGNOSIS — M9902 Segmental and somatic dysfunction of thoracic region: Secondary | ICD-10-CM | POA: Diagnosis not present

## 2021-04-01 DIAGNOSIS — M47816 Spondylosis without myelopathy or radiculopathy, lumbar region: Secondary | ICD-10-CM | POA: Diagnosis not present

## 2021-04-01 DIAGNOSIS — M9902 Segmental and somatic dysfunction of thoracic region: Secondary | ICD-10-CM | POA: Diagnosis not present

## 2021-04-01 DIAGNOSIS — S134XXA Sprain of ligaments of cervical spine, initial encounter: Secondary | ICD-10-CM | POA: Diagnosis not present

## 2021-04-01 DIAGNOSIS — M9901 Segmental and somatic dysfunction of cervical region: Secondary | ICD-10-CM | POA: Diagnosis not present

## 2021-04-01 DIAGNOSIS — S233XXA Sprain of ligaments of thoracic spine, initial encounter: Secondary | ICD-10-CM | POA: Diagnosis not present

## 2021-04-01 DIAGNOSIS — M9903 Segmental and somatic dysfunction of lumbar region: Secondary | ICD-10-CM | POA: Diagnosis not present

## 2021-04-22 DIAGNOSIS — M9901 Segmental and somatic dysfunction of cervical region: Secondary | ICD-10-CM | POA: Diagnosis not present

## 2021-04-22 DIAGNOSIS — M9903 Segmental and somatic dysfunction of lumbar region: Secondary | ICD-10-CM | POA: Diagnosis not present

## 2021-04-22 DIAGNOSIS — M9902 Segmental and somatic dysfunction of thoracic region: Secondary | ICD-10-CM | POA: Diagnosis not present

## 2021-04-22 DIAGNOSIS — S134XXA Sprain of ligaments of cervical spine, initial encounter: Secondary | ICD-10-CM | POA: Diagnosis not present

## 2021-04-22 DIAGNOSIS — M47816 Spondylosis without myelopathy or radiculopathy, lumbar region: Secondary | ICD-10-CM | POA: Diagnosis not present

## 2021-04-22 DIAGNOSIS — S233XXA Sprain of ligaments of thoracic spine, initial encounter: Secondary | ICD-10-CM | POA: Diagnosis not present

## 2021-05-06 DIAGNOSIS — M9902 Segmental and somatic dysfunction of thoracic region: Secondary | ICD-10-CM | POA: Diagnosis not present

## 2021-05-06 DIAGNOSIS — M9903 Segmental and somatic dysfunction of lumbar region: Secondary | ICD-10-CM | POA: Diagnosis not present

## 2021-05-06 DIAGNOSIS — M47816 Spondylosis without myelopathy or radiculopathy, lumbar region: Secondary | ICD-10-CM | POA: Diagnosis not present

## 2021-05-06 DIAGNOSIS — M9901 Segmental and somatic dysfunction of cervical region: Secondary | ICD-10-CM | POA: Diagnosis not present

## 2021-05-06 DIAGNOSIS — S233XXA Sprain of ligaments of thoracic spine, initial encounter: Secondary | ICD-10-CM | POA: Diagnosis not present

## 2021-05-06 DIAGNOSIS — S134XXA Sprain of ligaments of cervical spine, initial encounter: Secondary | ICD-10-CM | POA: Diagnosis not present

## 2021-05-20 DIAGNOSIS — M47816 Spondylosis without myelopathy or radiculopathy, lumbar region: Secondary | ICD-10-CM | POA: Diagnosis not present

## 2021-05-20 DIAGNOSIS — S134XXA Sprain of ligaments of cervical spine, initial encounter: Secondary | ICD-10-CM | POA: Diagnosis not present

## 2021-05-20 DIAGNOSIS — M9903 Segmental and somatic dysfunction of lumbar region: Secondary | ICD-10-CM | POA: Diagnosis not present

## 2021-05-20 DIAGNOSIS — S233XXA Sprain of ligaments of thoracic spine, initial encounter: Secondary | ICD-10-CM | POA: Diagnosis not present

## 2021-05-20 DIAGNOSIS — M9901 Segmental and somatic dysfunction of cervical region: Secondary | ICD-10-CM | POA: Diagnosis not present

## 2021-05-20 DIAGNOSIS — M9902 Segmental and somatic dysfunction of thoracic region: Secondary | ICD-10-CM | POA: Diagnosis not present

## 2021-05-27 DIAGNOSIS — S233XXA Sprain of ligaments of thoracic spine, initial encounter: Secondary | ICD-10-CM | POA: Diagnosis not present

## 2021-05-27 DIAGNOSIS — M9901 Segmental and somatic dysfunction of cervical region: Secondary | ICD-10-CM | POA: Diagnosis not present

## 2021-05-27 DIAGNOSIS — M9902 Segmental and somatic dysfunction of thoracic region: Secondary | ICD-10-CM | POA: Diagnosis not present

## 2021-05-27 DIAGNOSIS — M9903 Segmental and somatic dysfunction of lumbar region: Secondary | ICD-10-CM | POA: Diagnosis not present

## 2021-05-27 DIAGNOSIS — S134XXA Sprain of ligaments of cervical spine, initial encounter: Secondary | ICD-10-CM | POA: Diagnosis not present

## 2021-05-27 DIAGNOSIS — M47816 Spondylosis without myelopathy or radiculopathy, lumbar region: Secondary | ICD-10-CM | POA: Diagnosis not present

## 2021-06-10 DIAGNOSIS — M9902 Segmental and somatic dysfunction of thoracic region: Secondary | ICD-10-CM | POA: Diagnosis not present

## 2021-06-10 DIAGNOSIS — M9903 Segmental and somatic dysfunction of lumbar region: Secondary | ICD-10-CM | POA: Diagnosis not present

## 2021-06-10 DIAGNOSIS — S134XXA Sprain of ligaments of cervical spine, initial encounter: Secondary | ICD-10-CM | POA: Diagnosis not present

## 2021-06-10 DIAGNOSIS — M9901 Segmental and somatic dysfunction of cervical region: Secondary | ICD-10-CM | POA: Diagnosis not present

## 2021-06-10 DIAGNOSIS — S233XXA Sprain of ligaments of thoracic spine, initial encounter: Secondary | ICD-10-CM | POA: Diagnosis not present

## 2021-06-10 DIAGNOSIS — M47816 Spondylosis without myelopathy or radiculopathy, lumbar region: Secondary | ICD-10-CM | POA: Diagnosis not present

## 2021-06-24 DIAGNOSIS — M9903 Segmental and somatic dysfunction of lumbar region: Secondary | ICD-10-CM | POA: Diagnosis not present

## 2021-06-24 DIAGNOSIS — S233XXA Sprain of ligaments of thoracic spine, initial encounter: Secondary | ICD-10-CM | POA: Diagnosis not present

## 2021-06-24 DIAGNOSIS — M9902 Segmental and somatic dysfunction of thoracic region: Secondary | ICD-10-CM | POA: Diagnosis not present

## 2021-06-24 DIAGNOSIS — M47816 Spondylosis without myelopathy or radiculopathy, lumbar region: Secondary | ICD-10-CM | POA: Diagnosis not present

## 2021-06-24 DIAGNOSIS — M9901 Segmental and somatic dysfunction of cervical region: Secondary | ICD-10-CM | POA: Diagnosis not present

## 2021-06-24 DIAGNOSIS — S134XXA Sprain of ligaments of cervical spine, initial encounter: Secondary | ICD-10-CM | POA: Diagnosis not present

## 2021-07-08 DIAGNOSIS — M47816 Spondylosis without myelopathy or radiculopathy, lumbar region: Secondary | ICD-10-CM | POA: Diagnosis not present

## 2021-07-08 DIAGNOSIS — M9902 Segmental and somatic dysfunction of thoracic region: Secondary | ICD-10-CM | POA: Diagnosis not present

## 2021-07-08 DIAGNOSIS — S134XXA Sprain of ligaments of cervical spine, initial encounter: Secondary | ICD-10-CM | POA: Diagnosis not present

## 2021-07-08 DIAGNOSIS — S233XXA Sprain of ligaments of thoracic spine, initial encounter: Secondary | ICD-10-CM | POA: Diagnosis not present

## 2021-07-08 DIAGNOSIS — M9903 Segmental and somatic dysfunction of lumbar region: Secondary | ICD-10-CM | POA: Diagnosis not present

## 2021-07-08 DIAGNOSIS — M9901 Segmental and somatic dysfunction of cervical region: Secondary | ICD-10-CM | POA: Diagnosis not present

## 2021-07-22 DIAGNOSIS — M9903 Segmental and somatic dysfunction of lumbar region: Secondary | ICD-10-CM | POA: Diagnosis not present

## 2021-07-22 DIAGNOSIS — S233XXA Sprain of ligaments of thoracic spine, initial encounter: Secondary | ICD-10-CM | POA: Diagnosis not present

## 2021-07-22 DIAGNOSIS — M9901 Segmental and somatic dysfunction of cervical region: Secondary | ICD-10-CM | POA: Diagnosis not present

## 2021-07-22 DIAGNOSIS — S134XXA Sprain of ligaments of cervical spine, initial encounter: Secondary | ICD-10-CM | POA: Diagnosis not present

## 2021-07-22 DIAGNOSIS — M9902 Segmental and somatic dysfunction of thoracic region: Secondary | ICD-10-CM | POA: Diagnosis not present

## 2021-07-22 DIAGNOSIS — M47816 Spondylosis without myelopathy or radiculopathy, lumbar region: Secondary | ICD-10-CM | POA: Diagnosis not present

## 2021-08-05 DIAGNOSIS — M9901 Segmental and somatic dysfunction of cervical region: Secondary | ICD-10-CM | POA: Diagnosis not present

## 2021-08-05 DIAGNOSIS — M47816 Spondylosis without myelopathy or radiculopathy, lumbar region: Secondary | ICD-10-CM | POA: Diagnosis not present

## 2021-08-05 DIAGNOSIS — Z6831 Body mass index (BMI) 31.0-31.9, adult: Secondary | ICD-10-CM | POA: Diagnosis not present

## 2021-08-05 DIAGNOSIS — M9903 Segmental and somatic dysfunction of lumbar region: Secondary | ICD-10-CM | POA: Diagnosis not present

## 2021-08-05 DIAGNOSIS — R52 Pain, unspecified: Secondary | ICD-10-CM | POA: Diagnosis not present

## 2021-08-05 DIAGNOSIS — Z23 Encounter for immunization: Secondary | ICD-10-CM | POA: Diagnosis not present

## 2021-08-05 DIAGNOSIS — I10 Essential (primary) hypertension: Secondary | ICD-10-CM | POA: Diagnosis not present

## 2021-08-05 DIAGNOSIS — S233XXA Sprain of ligaments of thoracic spine, initial encounter: Secondary | ICD-10-CM | POA: Diagnosis not present

## 2021-08-05 DIAGNOSIS — Z87891 Personal history of nicotine dependence: Secondary | ICD-10-CM | POA: Diagnosis not present

## 2021-08-05 DIAGNOSIS — Z299 Encounter for prophylactic measures, unspecified: Secondary | ICD-10-CM | POA: Diagnosis not present

## 2021-08-05 DIAGNOSIS — M9902 Segmental and somatic dysfunction of thoracic region: Secondary | ICD-10-CM | POA: Diagnosis not present

## 2021-08-05 DIAGNOSIS — I700 Atherosclerosis of aorta: Secondary | ICD-10-CM | POA: Diagnosis not present

## 2021-08-05 DIAGNOSIS — I471 Supraventricular tachycardia: Secondary | ICD-10-CM | POA: Diagnosis not present

## 2021-08-05 DIAGNOSIS — S134XXA Sprain of ligaments of cervical spine, initial encounter: Secondary | ICD-10-CM | POA: Diagnosis not present

## 2021-08-19 DIAGNOSIS — M9903 Segmental and somatic dysfunction of lumbar region: Secondary | ICD-10-CM | POA: Diagnosis not present

## 2021-08-19 DIAGNOSIS — M9902 Segmental and somatic dysfunction of thoracic region: Secondary | ICD-10-CM | POA: Diagnosis not present

## 2021-08-19 DIAGNOSIS — M9901 Segmental and somatic dysfunction of cervical region: Secondary | ICD-10-CM | POA: Diagnosis not present

## 2021-08-19 DIAGNOSIS — M47816 Spondylosis without myelopathy or radiculopathy, lumbar region: Secondary | ICD-10-CM | POA: Diagnosis not present

## 2021-08-19 DIAGNOSIS — S233XXA Sprain of ligaments of thoracic spine, initial encounter: Secondary | ICD-10-CM | POA: Diagnosis not present

## 2021-08-19 DIAGNOSIS — S134XXA Sprain of ligaments of cervical spine, initial encounter: Secondary | ICD-10-CM | POA: Diagnosis not present

## 2021-09-02 DIAGNOSIS — M9902 Segmental and somatic dysfunction of thoracic region: Secondary | ICD-10-CM | POA: Diagnosis not present

## 2021-09-02 DIAGNOSIS — M47816 Spondylosis without myelopathy or radiculopathy, lumbar region: Secondary | ICD-10-CM | POA: Diagnosis not present

## 2021-09-02 DIAGNOSIS — S134XXA Sprain of ligaments of cervical spine, initial encounter: Secondary | ICD-10-CM | POA: Diagnosis not present

## 2021-09-02 DIAGNOSIS — S233XXA Sprain of ligaments of thoracic spine, initial encounter: Secondary | ICD-10-CM | POA: Diagnosis not present

## 2021-09-02 DIAGNOSIS — M9903 Segmental and somatic dysfunction of lumbar region: Secondary | ICD-10-CM | POA: Diagnosis not present

## 2021-09-02 DIAGNOSIS — M9901 Segmental and somatic dysfunction of cervical region: Secondary | ICD-10-CM | POA: Diagnosis not present

## 2021-09-16 DIAGNOSIS — S134XXA Sprain of ligaments of cervical spine, initial encounter: Secondary | ICD-10-CM | POA: Diagnosis not present

## 2021-09-16 DIAGNOSIS — M9902 Segmental and somatic dysfunction of thoracic region: Secondary | ICD-10-CM | POA: Diagnosis not present

## 2021-09-16 DIAGNOSIS — M47816 Spondylosis without myelopathy or radiculopathy, lumbar region: Secondary | ICD-10-CM | POA: Diagnosis not present

## 2021-09-16 DIAGNOSIS — S233XXA Sprain of ligaments of thoracic spine, initial encounter: Secondary | ICD-10-CM | POA: Diagnosis not present

## 2021-09-16 DIAGNOSIS — M9901 Segmental and somatic dysfunction of cervical region: Secondary | ICD-10-CM | POA: Diagnosis not present

## 2021-09-16 DIAGNOSIS — M9903 Segmental and somatic dysfunction of lumbar region: Secondary | ICD-10-CM | POA: Diagnosis not present

## 2021-09-30 DIAGNOSIS — M47816 Spondylosis without myelopathy or radiculopathy, lumbar region: Secondary | ICD-10-CM | POA: Diagnosis not present

## 2021-09-30 DIAGNOSIS — M9903 Segmental and somatic dysfunction of lumbar region: Secondary | ICD-10-CM | POA: Diagnosis not present

## 2021-09-30 DIAGNOSIS — M9901 Segmental and somatic dysfunction of cervical region: Secondary | ICD-10-CM | POA: Diagnosis not present

## 2021-09-30 DIAGNOSIS — M9902 Segmental and somatic dysfunction of thoracic region: Secondary | ICD-10-CM | POA: Diagnosis not present

## 2021-09-30 DIAGNOSIS — S233XXA Sprain of ligaments of thoracic spine, initial encounter: Secondary | ICD-10-CM | POA: Diagnosis not present

## 2021-09-30 DIAGNOSIS — S134XXA Sprain of ligaments of cervical spine, initial encounter: Secondary | ICD-10-CM | POA: Diagnosis not present

## 2021-10-14 DIAGNOSIS — M9903 Segmental and somatic dysfunction of lumbar region: Secondary | ICD-10-CM | POA: Diagnosis not present

## 2021-10-14 DIAGNOSIS — S233XXA Sprain of ligaments of thoracic spine, initial encounter: Secondary | ICD-10-CM | POA: Diagnosis not present

## 2021-10-14 DIAGNOSIS — M9902 Segmental and somatic dysfunction of thoracic region: Secondary | ICD-10-CM | POA: Diagnosis not present

## 2021-10-14 DIAGNOSIS — M9901 Segmental and somatic dysfunction of cervical region: Secondary | ICD-10-CM | POA: Diagnosis not present

## 2021-10-14 DIAGNOSIS — S134XXA Sprain of ligaments of cervical spine, initial encounter: Secondary | ICD-10-CM | POA: Diagnosis not present

## 2021-10-14 DIAGNOSIS — M47816 Spondylosis without myelopathy or radiculopathy, lumbar region: Secondary | ICD-10-CM | POA: Diagnosis not present

## 2021-10-28 DIAGNOSIS — S233XXA Sprain of ligaments of thoracic spine, initial encounter: Secondary | ICD-10-CM | POA: Diagnosis not present

## 2021-10-28 DIAGNOSIS — M9902 Segmental and somatic dysfunction of thoracic region: Secondary | ICD-10-CM | POA: Diagnosis not present

## 2021-10-28 DIAGNOSIS — M9901 Segmental and somatic dysfunction of cervical region: Secondary | ICD-10-CM | POA: Diagnosis not present

## 2021-10-28 DIAGNOSIS — S134XXA Sprain of ligaments of cervical spine, initial encounter: Secondary | ICD-10-CM | POA: Diagnosis not present

## 2021-10-28 DIAGNOSIS — M47816 Spondylosis without myelopathy or radiculopathy, lumbar region: Secondary | ICD-10-CM | POA: Diagnosis not present

## 2021-10-28 DIAGNOSIS — M9903 Segmental and somatic dysfunction of lumbar region: Secondary | ICD-10-CM | POA: Diagnosis not present

## 2021-11-02 DIAGNOSIS — Z7189 Other specified counseling: Secondary | ICD-10-CM | POA: Diagnosis not present

## 2021-11-02 DIAGNOSIS — Z299 Encounter for prophylactic measures, unspecified: Secondary | ICD-10-CM | POA: Diagnosis not present

## 2021-11-02 DIAGNOSIS — Z6831 Body mass index (BMI) 31.0-31.9, adult: Secondary | ICD-10-CM | POA: Diagnosis not present

## 2021-11-02 DIAGNOSIS — I10 Essential (primary) hypertension: Secondary | ICD-10-CM | POA: Diagnosis not present

## 2021-11-02 DIAGNOSIS — E7800 Pure hypercholesterolemia, unspecified: Secondary | ICD-10-CM | POA: Diagnosis not present

## 2021-11-02 DIAGNOSIS — Z125 Encounter for screening for malignant neoplasm of prostate: Secondary | ICD-10-CM | POA: Diagnosis not present

## 2021-11-02 DIAGNOSIS — Z1331 Encounter for screening for depression: Secondary | ICD-10-CM | POA: Diagnosis not present

## 2021-11-02 DIAGNOSIS — Z1339 Encounter for screening examination for other mental health and behavioral disorders: Secondary | ICD-10-CM | POA: Diagnosis not present

## 2021-11-02 DIAGNOSIS — R5383 Other fatigue: Secondary | ICD-10-CM | POA: Diagnosis not present

## 2021-11-02 DIAGNOSIS — Z79899 Other long term (current) drug therapy: Secondary | ICD-10-CM | POA: Diagnosis not present

## 2021-11-02 DIAGNOSIS — Z0000 Encounter for general adult medical examination without abnormal findings: Secondary | ICD-10-CM | POA: Diagnosis not present

## 2021-11-20 ENCOUNTER — Encounter: Payer: Self-pay | Admitting: Cardiology

## 2021-11-20 DIAGNOSIS — R7309 Other abnormal glucose: Secondary | ICD-10-CM | POA: Diagnosis not present

## 2021-11-20 DIAGNOSIS — Z299 Encounter for prophylactic measures, unspecified: Secondary | ICD-10-CM | POA: Diagnosis not present

## 2021-11-20 DIAGNOSIS — I10 Essential (primary) hypertension: Secondary | ICD-10-CM | POA: Diagnosis not present

## 2021-12-09 DIAGNOSIS — M9903 Segmental and somatic dysfunction of lumbar region: Secondary | ICD-10-CM | POA: Diagnosis not present

## 2021-12-09 DIAGNOSIS — M47816 Spondylosis without myelopathy or radiculopathy, lumbar region: Secondary | ICD-10-CM | POA: Diagnosis not present

## 2021-12-09 DIAGNOSIS — M9902 Segmental and somatic dysfunction of thoracic region: Secondary | ICD-10-CM | POA: Diagnosis not present

## 2021-12-09 DIAGNOSIS — M9901 Segmental and somatic dysfunction of cervical region: Secondary | ICD-10-CM | POA: Diagnosis not present

## 2021-12-09 DIAGNOSIS — S134XXA Sprain of ligaments of cervical spine, initial encounter: Secondary | ICD-10-CM | POA: Diagnosis not present

## 2021-12-09 DIAGNOSIS — S233XXA Sprain of ligaments of thoracic spine, initial encounter: Secondary | ICD-10-CM | POA: Diagnosis not present

## 2022-02-08 NOTE — Progress Notes (Signed)
? ? ?Cardiology Office Note ? ?Date: 02/09/2022  ? ?ID: Todd Nelson, DOB [birth year], MRN [id number] ? ?PCP:  Glenda Chroman, MD  ?Cardiologist:  Rozann Lesches, MD ?Electrophysiologist:  None  ? ?Chief Complaint  ?Patient presents with  ? Cardiac follow-up  ? ? ?History of Present Illness: ?Todd Nelson is an 81 y.o. male last seen in February 2022.  He is here for a routine visit.  He reports no angina symptoms with typical ADLs, no major change in stamina, NYHA class II dyspnea. ? ?I reviewed his medications which are noted below.  Today's ECG shows sinus bradycardia.  We are requesting his most recent lab work from Dr. Woody Seller. ? ?Exercise Myoview in February 2022 was low risk as noted below. ? ?Past Medical History:  ?Diagnosis Date  ? Arthritis   ? Barrett esophagus 01/2008  ? CAD (coronary artery disease)   ? Mild to moderate nonobstructive disease 2002  ? Essential hypertension   ? GERD (gastroesophageal reflux disease)   ? Hemorrhoids, external   ? Hyperlipidemia   ? Osteoarthritis   ? Schatzki's ring 01/2008  ? Status post dilatation  ? Umbilical hernia   ? ? ?Past Surgical History:  ?Procedure Laterality Date  ? APPENDECTOMY    ? BIOPSY  06/27/2019  ? Procedure: BIOPSY;  Surgeon: Daneil Dolin, MD;  [facility];  Service: Endoscopy;;  esophagus  ? COLONOSCOPY  2005  ? hyperplastic polyps,hemorrhoids  ? COLONOSCOPY N/A 02/04/2014  ? Rourk: normal. consider one more screening in 10 years  ? ESOPHAGOGASTRODUODENOSCOPY  02/06/08  ? RMR: short-segment Barrett's, Schatzki's ring s/p dilation   ? ESOPHAGOGASTRODUODENOSCOPY  July 2010  ? RMR: short-segment Barrett's  ? ESOPHAGOGASTRODUODENOSCOPY N/A 03/02/2016  ? Fundic gland polyps, Barrett's without dysplasia. Next EGD June 2020.  ? ESOPHAGOGASTRODUODENOSCOPY N/A 06/27/2019  ? Procedure: ESOPHAGOGASTRODUODENOSCOPY (EGD);  Surgeon: Daneil Dolin, MD;  [facility];  Service: Endoscopy;  Laterality: N/A;  9:30am  ? ESOPHAGOGASTRODUODENOSCOPY  (EGD) WITH ESOPHAGEAL DILATION  08/21/2012  ? RMR: noncritical Schatzki ring, small hh, short-segment Barretts with no dysplasia.H.pylori gastritis  ? KNEE SURGERY  2011  ? both knees-Bil knee replacement  ? NECK SURGERY  2008  ? plate  ? TONSILLECTOMY    ? ? ?Current Outpatient Medications  ?Medication Sig Dispense Refill  ? amLODipine (NORVASC) 10 MG tablet Take 10 mg by mouth daily.    ? aspirin 81 MG tablet Take 81 mg by mouth daily.    ? fexofenadine (ALLEGRA) 180 MG tablet Take 180 mg by mouth daily as needed for allergies.    ? fish oil-omega-3 fatty acids 1000 MG capsule Take 1 g by mouth daily.    ? irbesartan-hydrochlorothiazide (AVALIDE) 150-12.5 MG tablet Take 1 tablet by mouth daily.    ? meloxicam (MOBIC) 7.5 MG tablet Take 7.5 mg by mouth daily.    ? metoprolol succinate (TOPROL-XL) 25 MG 24 hr tablet Take 25 mg by mouth daily.    ? omeprazole (PRILOSEC) 20 MG capsule Take 20 mg by mouth daily.    ? simvastatin (ZOCOR) 20 MG tablet Take 20 mg by mouth at bedtime.    ? ?No current facility-administered medications for this visit.  ? ?Allergies:  Patient has no known allergies.  ? ?ROS: No palpitations or syncope. ? ?Physical Exam: ?VS:  BP 128/68   Pulse 60   Ht '5\' 10"'$  (1.778 m)   Wt 191 lb (86.6 kg)   SpO2 96%  BMI 27.41 kg/m? , BMI Body mass index is 27.41 kg/m?. ? ?Wt Readings from Last 3 Encounters:  ?02/09/22 191 lb (86.6 kg)  ?11/07/20 191 lb (86.6 kg)  ?10/05/19 179 lb (81.2 kg)  ?  ?General: Patient appears comfortable at rest. ?HEENT: Conjunctiva and lids normal. ?Neck: Supple, no elevated JVP or carotid bruits, no thyromegaly. ?Lungs: Clear to auscultation, nonlabored breathing at rest. ?Cardiac: Regular rate and rhythm, no S3 or significant systolic murmur, no pericardial rub. ?Extremities: No pitting edema. ? ?ECG:  An ECG dated 11/07/2020 was personally reviewed today and demonstrated:  Sinus rhythm. ? ?Recent Labwork: ? ?05/16/2021: Hemoglobin 15.1, platelets 265 ? ?Other Studies  Reviewed Today: ? ?Echocardiogram 10/18/2019: ? 1. Left ventricular ejection fraction, by visual estimation, is 70 to  ?75%. The left ventricle has hyperdynamic function. There is mildly  ?increased left ventricular hypertrophy.  ? 2. Left ventricular diastolic parameters are consistent with Grade I  ?diastolic dysfunction (impaired relaxation).  ? 3. The left ventricle has no regional wall motion abnormalities.  ? 4. Turbulent LVOT flow without a significant gradient is created by a  ?hyperdynamic LV with basal septal hypertrophy and mild SAM.  ? 5. Global right ventricle has normal systolic function.The right  ?ventricular size is normal. No increase in right ventricular wall  ?thickness.  ? 6. Left atrial size was normal.  ? 7. Right atrial size was normal.  ? 8. Mild mitral annular calcification.  ? 9. The mitral valve is grossly normal. Trivial mitral valve  ?regurgitation. No evidence of mitral stenosis.  ?10. The tricuspid valve is normal in structure.  ?11. The tricuspid valve is normal in structure. Tricuspid valve  ?regurgitation is trivial.  ?12. The aortic valve is tricuspid. Aortic valve regurgitation is not  ?visualized. No evidence of aortic valve sclerosis or stenosis.  ?13. The pulmonic valve was not well visualized. Pulmonic valve  ?regurgitation is not visualized.  ?14. Mildly elevated pulmonary artery systolic pressure. ? ?Exercise Myoview 11/12/2020: ?Blood pressure demonstrated a hypertensive response to exercise. ?The study is normal. ?This is a low risk study. ?The left ventricular ejection fraction is hyperdynamic (>65%). ?There was no ST segment deviation noted during stress. ?  ?Normal resting and stress perfusion. No ischemia or infarction EF ?77% Poor functional status only achieved 4.6 METS but did  ?Reach 90% PMHR  ? ?Assessment and Plan: ? ?1.  Mild to moderate CAD by prior cardiac catheterization with follow-up exercise Myoview last year low risk as outlined above.  He is  symptomatically stable on medical therapy and we will continue with observation.  ECG reviewed.  Continue aspirin, Toprol-XL, Avalide, Norvasc, and Zocor. ? ?2.  Mixed hyperlipidemia on Zocor.  Plan to obtain interval lab work from PCP. ? ?Medication Adjustments/Labs and Tests Ordered: ?Current medicines are reviewed at length with the patient today.  Concerns regarding medicines are outlined above.  ? ?Tests Ordered: ?Orders Placed This Encounter  ?Procedures  ? EKG 12-Lead  ? ? ?Medication Changes: ?No orders of the defined types were placed in this encounter. ? ? ?Disposition:  Follow up  1 year. ? ?Signed, ?Satira Sark, MD, [facility] ?02/09/2022 9:50 AM    ?[place] [facility] ?[place], [place], [place] [address] ?Phone: [phone]; Fax: [phone]  ?

## 2022-02-09 ENCOUNTER — Ambulatory Visit (INDEPENDENT_AMBULATORY_CARE_PROVIDER_SITE_OTHER): Payer: Medicare Other | Admitting: Cardiology

## 2022-02-09 ENCOUNTER — Encounter: Payer: Self-pay | Admitting: Cardiology

## 2022-02-09 VITALS — BP 128/68 | HR 60 | Ht 70.0 in | Wt 191.0 lb

## 2022-02-09 DIAGNOSIS — I25119 Atherosclerotic heart disease of native coronary artery with unspecified angina pectoris: Secondary | ICD-10-CM

## 2022-02-09 DIAGNOSIS — E782 Mixed hyperlipidemia: Secondary | ICD-10-CM | POA: Diagnosis not present

## 2022-02-09 NOTE — Patient Instructions (Signed)

## 2022-02-16 DIAGNOSIS — L570 Actinic keratosis: Secondary | ICD-10-CM | POA: Diagnosis not present

## 2022-02-16 DIAGNOSIS — Z1283 Encounter for screening for malignant neoplasm of skin: Secondary | ICD-10-CM | POA: Diagnosis not present

## 2022-02-16 DIAGNOSIS — D239 Other benign neoplasm of skin, unspecified: Secondary | ICD-10-CM | POA: Diagnosis not present

## 2022-04-06 DIAGNOSIS — H26491 Other secondary cataract, right eye: Secondary | ICD-10-CM | POA: Diagnosis not present

## 2022-04-07 DIAGNOSIS — I700 Atherosclerosis of aorta: Secondary | ICD-10-CM | POA: Diagnosis not present

## 2022-04-07 DIAGNOSIS — I471 Supraventricular tachycardia: Secondary | ICD-10-CM | POA: Diagnosis not present

## 2022-04-07 DIAGNOSIS — D692 Other nonthrombocytopenic purpura: Secondary | ICD-10-CM | POA: Diagnosis not present

## 2022-04-07 DIAGNOSIS — J320 Chronic maxillary sinusitis: Secondary | ICD-10-CM | POA: Diagnosis not present

## 2022-04-07 DIAGNOSIS — Z299 Encounter for prophylactic measures, unspecified: Secondary | ICD-10-CM | POA: Diagnosis not present

## 2022-04-12 DIAGNOSIS — H26491 Other secondary cataract, right eye: Secondary | ICD-10-CM | POA: Diagnosis not present

## 2022-04-14 DIAGNOSIS — H6123 Impacted cerumen, bilateral: Secondary | ICD-10-CM | POA: Diagnosis not present

## 2022-04-14 DIAGNOSIS — H9193 Unspecified hearing loss, bilateral: Secondary | ICD-10-CM | POA: Diagnosis not present

## 2022-04-26 DIAGNOSIS — Z961 Presence of intraocular lens: Secondary | ICD-10-CM | POA: Diagnosis not present

## 2022-05-04 DIAGNOSIS — E7800 Pure hypercholesterolemia, unspecified: Secondary | ICD-10-CM | POA: Diagnosis not present

## 2022-05-04 DIAGNOSIS — R059 Cough, unspecified: Secondary | ICD-10-CM | POA: Diagnosis not present

## 2022-05-04 DIAGNOSIS — I10 Essential (primary) hypertension: Secondary | ICD-10-CM | POA: Diagnosis not present

## 2022-05-04 DIAGNOSIS — Z6831 Body mass index (BMI) 31.0-31.9, adult: Secondary | ICD-10-CM | POA: Diagnosis not present

## 2022-05-04 DIAGNOSIS — R739 Hyperglycemia, unspecified: Secondary | ICD-10-CM | POA: Diagnosis not present

## 2022-05-04 DIAGNOSIS — Z299 Encounter for prophylactic measures, unspecified: Secondary | ICD-10-CM | POA: Diagnosis not present

## 2022-05-19 DIAGNOSIS — R059 Cough, unspecified: Secondary | ICD-10-CM | POA: Diagnosis not present

## 2022-05-19 DIAGNOSIS — J069 Acute upper respiratory infection, unspecified: Secondary | ICD-10-CM | POA: Diagnosis not present

## 2022-05-19 DIAGNOSIS — Z87891 Personal history of nicotine dependence: Secondary | ICD-10-CM | POA: Diagnosis not present

## 2022-05-19 DIAGNOSIS — I10 Essential (primary) hypertension: Secondary | ICD-10-CM | POA: Diagnosis not present

## 2022-05-19 DIAGNOSIS — Z299 Encounter for prophylactic measures, unspecified: Secondary | ICD-10-CM | POA: Diagnosis not present

## 2022-05-24 DIAGNOSIS — I700 Atherosclerosis of aorta: Secondary | ICD-10-CM | POA: Diagnosis not present

## 2022-05-24 DIAGNOSIS — I358 Other nonrheumatic aortic valve disorders: Secondary | ICD-10-CM | POA: Diagnosis not present

## 2022-05-24 DIAGNOSIS — I2510 Atherosclerotic heart disease of native coronary artery without angina pectoris: Secondary | ICD-10-CM | POA: Diagnosis not present

## 2022-05-24 DIAGNOSIS — R918 Other nonspecific abnormal finding of lung field: Secondary | ICD-10-CM | POA: Diagnosis not present

## 2022-06-07 DIAGNOSIS — R5383 Other fatigue: Secondary | ICD-10-CM | POA: Diagnosis not present

## 2022-06-07 DIAGNOSIS — R059 Cough, unspecified: Secondary | ICD-10-CM | POA: Diagnosis not present

## 2022-06-07 DIAGNOSIS — Z299 Encounter for prophylactic measures, unspecified: Secondary | ICD-10-CM | POA: Diagnosis not present

## 2022-06-14 ENCOUNTER — Encounter: Payer: Self-pay | Admitting: *Deleted

## 2022-06-14 DIAGNOSIS — R0602 Shortness of breath: Secondary | ICD-10-CM | POA: Diagnosis not present

## 2022-07-07 ENCOUNTER — Ambulatory Visit: Payer: Medicare Other | Admitting: Internal Medicine

## 2022-07-14 ENCOUNTER — Encounter: Payer: Self-pay | Admitting: Internal Medicine

## 2022-07-14 ENCOUNTER — Ambulatory Visit (INDEPENDENT_AMBULATORY_CARE_PROVIDER_SITE_OTHER): Payer: Medicare Other | Admitting: Internal Medicine

## 2022-07-14 ENCOUNTER — Encounter: Payer: Self-pay | Admitting: *Deleted

## 2022-07-14 VITALS — BP 135/75 | HR 60 | Temp 97.7°F | Ht 70.0 in | Wt 186.2 lb

## 2022-07-14 DIAGNOSIS — K219 Gastro-esophageal reflux disease without esophagitis: Secondary | ICD-10-CM

## 2022-07-14 DIAGNOSIS — K2270 Barrett's esophagus without dysplasia: Secondary | ICD-10-CM

## 2022-07-14 DIAGNOSIS — R1319 Other dysphagia: Secondary | ICD-10-CM

## 2022-07-14 DIAGNOSIS — I25119 Atherosclerotic heart disease of native coronary artery with unspecified angina pectoris: Secondary | ICD-10-CM | POA: Diagnosis not present

## 2022-07-14 NOTE — Patient Instructions (Signed)
It was good to see you again today!  As discussed, we will schedule a repeat EGD with esophageal dilation as appropriate (history of Barrett's and esophageal dysphagia-ASA 2)  Continue omeprazole 20 mg daily  As discussed, you do not need a future colonoscopy unless new symptoms develop  Further recommendations to follow.

## 2022-07-14 NOTE — Progress Notes (Signed)
Primary Care Physician:  Ignatius Specking, MD Primary Gastroenterologist:  Dr.   Pre-Procedure History & Physical: HPI:  Todd Nelson is a 81 y.o. male here for consideration of a surveillance EGD/history of Barrett's esophagus.  Patient has history of established short segment Barrett's esophagus without dysplasia.  Last EGD 2017.  He has a history of Schatzki's ring.  He notes reflux symptoms well controlled on omeprazole 20 mg daily.  However, he has developed recurrent esophageal dysphagia to solids.  Has had no melena or rectal bleeding, vomiting or change in weight.  He had a negative screening colonoscopy at age 89; no lower GI tract symptoms.  Past Medical History:  Diagnosis Date   Arthritis    Barrett esophagus 01/2008   CAD (coronary artery disease)    Mild to moderate nonobstructive disease 2002   Essential hypertension    GERD (gastroesophageal reflux disease)    Hemorrhoids, external    Hyperlipidemia    Osteoarthritis    Schatzki's ring 01/2008   Status post dilatation   Umbilical hernia     Past Surgical History:  Procedure Laterality Date   APPENDECTOMY     BIOPSY  06/27/2019   Procedure: BIOPSY;  Surgeon: Corbin Ade, MD;  Location: [facility];  Service: Endoscopy;;  esophagus   COLONOSCOPY  2005   hyperplastic polyps,hemorrhoids   COLONOSCOPY N/A 02/04/2014   Wynona Duhamel: normal. consider one more screening in 10 years   ESOPHAGOGASTRODUODENOSCOPY  02/06/08   RMR: short-segment Barrett's, Schatzki's ring s/p dilation    ESOPHAGOGASTRODUODENOSCOPY  July 2010   RMR: short-segment Barrett's   ESOPHAGOGASTRODUODENOSCOPY N/A 03/02/2016   Fundic gland polyps, Barrett's without dysplasia. Next EGD June 2020.   ESOPHAGOGASTRODUODENOSCOPY N/A 06/27/2019   Procedure: ESOPHAGOGASTRODUODENOSCOPY (EGD);  Surgeon: Corbin Ade, MD;  Location: [facility];  Service: Endoscopy;  Laterality: N/A;  9:30am   ESOPHAGOGASTRODUODENOSCOPY (EGD) WITH ESOPHAGEAL DILATION   08/21/2012   RMR: noncritical Schatzki ring, small hh, short-segment Barretts with no dysplasia.H.pylori gastritis   KNEE SURGERY  2011   both knees-Bil knee replacement   NECK SURGERY  2008   plate   TONSILLECTOMY      Prior to Admission medications   Medication Sig Start Date End Date Taking? Authorizing Provider  amLODipine (NORVASC) 10 MG tablet Take 10 mg by mouth daily.   Yes [provider]  Ascorbic Acid (VITAMIN C) 1000 MG tablet Take 1,000 mg by mouth daily.   Yes [provider]  aspirin 81 MG tablet Take 81 mg by mouth daily.   Yes [provider]  cetirizine (ZYRTEC) 10 MG tablet Take 10 mg by mouth at bedtime. 06/28/22  Yes [provider]  Cyanocobalamin (VITAMIN B-12) 5000 MCG TBDP Take 1 tablet by mouth daily.   Yes [provider]  fish oil-omega-3 fatty acids 1000 MG capsule Take 1 g by mouth daily.   Yes [provider]  irbesartan-hydrochlorothiazide (AVALIDE) 150-12.5 MG tablet Take 1 tablet by mouth daily.   Yes [provider]  meloxicam (MOBIC) 7.5 MG tablet Take 7.5 mg by mouth daily.   Yes [provider]  metoprolol succinate (TOPROL-XL) 25 MG 24 hr tablet Take 25 mg by mouth daily.   Yes [provider]  omeprazole (PRILOSEC) 20 MG capsule Take 20 mg by mouth daily.   Yes [provider]  rosuvastatin (CRESTOR) 10 MG tablet Take 10 mg by mouth daily. 05/25/22  Yes [provider]  Zinc 50 MG  TABS Take 1 tablet by mouth daily.   Yes [provider]    Allergies as of 07/14/2022   (No Known Allergies)    Family History  Problem Relation Age of Onset   Pancreatic cancer Father 11   Hypertension Brother     Social History   Socioeconomic History   Marital status: Married    Spouse name: Not on file   Number of children: Not on file   Years of education: Not on file   Highest education level: Not on file  Occupational History   Occupation:  retired    Comment: from [employer]   Occupation: part-time    Comment: [employer]   Tobacco Use   Smoking status: Former    Packs/day: 1.00    Years: 20.00    Total pack years: 20.00    Types: Cigarettes    Start date: 07/24/1954    Quit date: 09/27/1984    Years since quitting: 37.8   Smokeless tobacco: Never  Vaping Use   Vaping Use: Never used  Substance and Sexual Activity   Alcohol use: Not Currently    Comment: 2 beers a day but not daily   Drug use: No   Sexual activity: Yes    Birth control/protection: None  Other Topics Concern   Not on file  Social History Narrative   Not on file   Social Determinants of Health   Financial Resource Strain: Not on file  Food Insecurity: Not on file  Transportation Needs: Not on file  Physical Activity: Not on file  Stress: Not on file  Social Connections: Not on file  Intimate Partner Violence: Not on file    Review of Systems: See HPI, otherwise negative ROS  Physical Exam: BP 135/75 (BP Location: Right Arm, Patient Position: Sitting, Cuff Size: Normal)   Pulse 60   Temp 97.7 F (36.5 C) (Oral)   Ht 5\' 10"  (1.778 m)   Wt 186 lb 3.2 oz (84.5 kg)   SpO2 97%   BMI 26.72 kg/m  General:   Alert,  Well-developed, well-nourished, pleasant and cooperative in NAD Neck:  Supple; no masses or thyromegaly. No significant cervical adenopathy. Lungs:  Clear throughout to auscultation.   No wheezes, crackles, or rhonchi. No acute distress. Heart:  Regular rate and rhythm; no murmurs, clicks, rubs,  or gallops. Abdomen: Non-distended, normal bowel sounds.  Soft and nontender without appreciable mass or hepatosplenomegaly.  Pulses:  Normal pulses noted. Extremities:  Without clubbing or edema.  Impression/Plan: 81 year old gentleman with short segment Barrett's esophagus now with recurrent esophageal dysphagia in the setting of well-controlled GERD on omeprazole 20 mg daily.  As far as Barrett's surveillance is concerned, he is reaching age  limit for surveillance.  Prior EGD findings/histology not very impressive for significant specialized columnar epithelium.  However, he does have recurrent esophageal dysphagia and a history of a Schatzki's ring which needs further evaluation/management.  I have offered the patient an EGD to size up Barrett's and dysphagia.  ASA 2. The risks, benefits, limitations, alternatives and imponderables have been reviewed with the patient. Potential for esophageal dilation, biopsy, etc. have also been reviewed.  Questions have been answered. All parties agreeable.      Notice: This dictation was prepared with Dragon dictation along with smaller phrase technology. Any transcriptional errors that result from this process are unintentional and may not be corrected upon review.

## 2022-08-06 ENCOUNTER — Ambulatory Visit (INDEPENDENT_AMBULATORY_CARE_PROVIDER_SITE_OTHER): Payer: Medicare Other | Admitting: Internal Medicine

## 2022-08-06 ENCOUNTER — Encounter: Payer: Self-pay | Admitting: Internal Medicine

## 2022-08-06 VITALS — BP 122/72 | HR 76 | Temp 97.7°F | Ht 70.0 in | Wt 187.0 lb

## 2022-08-06 DIAGNOSIS — R059 Cough, unspecified: Secondary | ICD-10-CM

## 2022-08-06 DIAGNOSIS — Z23 Encounter for immunization: Secondary | ICD-10-CM | POA: Diagnosis not present

## 2022-08-06 DIAGNOSIS — I25119 Atherosclerotic heart disease of native coronary artery with unspecified angina pectoris: Secondary | ICD-10-CM

## 2022-08-06 DIAGNOSIS — R058 Other specified cough: Secondary | ICD-10-CM | POA: Diagnosis not present

## 2022-08-06 LAB — POCT EXHALED NITRIC OXIDE: FeNO level (ppb): 38

## 2022-08-06 MED ORDER — FAMOTIDINE 20 MG PO TABS: ORAL_TABLET | ORAL | 11 refills | Status: DC

## 2022-08-06 MED ORDER — PANTOPRAZOLE SODIUM 40 MG PO TBEC: 40.0000 mg | DELAYED_RELEASE_TABLET | Freq: Every day | ORAL | 2 refills | Status: AC

## 2022-08-06 MED ORDER — BENZONATATE 200 MG PO CAPS: 200.0000 mg | ORAL_CAPSULE | Freq: Three times a day (TID) | ORAL | 1 refills | Status: DC | PRN

## 2022-08-06 MED ORDER — PREDNISONE 10 MG PO TABS: ORAL_TABLET | ORAL | 0 refills | Status: DC

## 2022-08-06 NOTE — Assessment & Plan Note (Addendum)
Onset spring 2023 in setting of seasonal rhintis and Barrett's  - Allergy screen 08/06/2022 >  Eos 0. /  IgE pending   - FEN0  38/75/6433  38  - cyclical cough rx with tessalon 08/06/2022 >>>   FEN0 is in the higher range of normal with a hx c/w allergic rhinitis and asthma as child so this could be cough variant asthma but note only 50 % improved with prednisone so will hold off trial of ICS and instead focus on cyclical/ upper airway cough that mimicks asthma  Of the three most common causes of  Sub-acute / recurrent or chronic cough, only one (GERD)  can actually contribute to/ trigger  the other two (asthma and post nasal drip syndrome)  and perpetuate the cylce of cough.  While not intuitively obvious, many patients with chronic low grade reflux do not cough until there is a primary insult that disturbs the protective epithelial barrier and exposes sensitive nerve endings.   This is typically viral but can due to PNDS and  latter more likely to apply here.   >>>   The point is that once this occurs, it is difficult to eliminate the cycle  using anything but a maximally effective acid suppression regimen at least in the short run, accompanied by an appropriate diet to address non acid GERD and control / eliminate the cough itself for at least 3 days with tessalon pearls  >>> also so added 6 day taper off  Prednisone starting at 40 mg per day in case of component of Th-2 driven upper or lower airways inflammation (if cough responds short term only to relapse before return while will on full rx for uacs (as above), then  that would point to allergic rhinitis/ asthma or eos bronchitis as alternative dx)   Will await allergy screen and  response to above rx before exploring next step which may include an empirical trial of ICS vs methacholine challenge testing and / or allergy referral  Discussed in detail all the  indications, usual  risks and alternatives  relative to the benefits with patient who  agrees to proceed with w/u as outlined.            Each maintenance medication was reviewed in detail including emphasizing most importantly the difference between maintenance and prns and under what circumstances the prns are to be triggered using an action plan format where appropriate.  Total time for H and P, chart review, counseling,  and generating customized AVS unique to this office visit / same day charting > 45 min with pt new to me with refactory chronic resp symptoms of unclear etiology

## 2022-08-06 NOTE — Patient Instructions (Addendum)
Prednisone 10 mg take  4 each am x 2 days,   2 each am x 2 days,  1 each am x 2 days and stop   For cough > Tessalon (benzoate)  pearls 200 mg three times a day - suppress even the urge to cough   Stop omeprazole (prilosec) and start Pantoprazole (protonix) 40 mg   Take  30-60 min before first meal of the day and Pepcid (famotidine)  20 mg after supper until cough gone for month without need for cough medications then ok to resume your prior medication (omeprazole)   GERD (REFLUX)  is an extremely common cause of respiratory symptoms just like yours , many times with no obvious heartburn at all.    It can be treated with medication, but also with lifestyle changes including elevation of the head of your bed (ideally with 6-8inch blocks under the headboard of your bed),  Smoking cessation, avoidance of late meals, excessive alcohol, and avoid fatty foods, chocolate, peppermint, colas, red wine, and acidic juices such as orange juice.  NO MINT OR MENTHOL PRODUCTS SO NO COUGH DROPS  USE SUGARLESS CANDY INSTEAD (Jolley ranchers or Stover's or Life Savers) or even ice chips will also do - the key is to swallow to prevent all throat clearing. NO OIL BASED VITAMINS - use powdered substitutes.  Avoid fish oil when coughing.    Please remember to go to the lab department   for your tests - we will call you with the results when they are available.      If not all better in a week call for next step.

## 2022-08-06 NOTE — Progress Notes (Signed)
Todd Nelson, male    DOB: 02/18/1941    MRN: [id number]   Brief patient profile:  81   yowm  quit smoking 1986 with asthma as child mostly resolved by teenage years with lifelong issue cough when around strong smells then around 2010 onset rhinitis  started needing allegra esp spring  referred to pulmonary clinic in [place]  08/06/2022 by Dr Woody Seller  for cough onset spring 2023 assoc with flare  rhinitis symptoms > zyrtec rec by Dr Woody Seller > helped some and transiently  improved only while on pred and then only 59% responsive    Covid 2020 spent one night in ER   Echo 06/14/22 Mild/mod MR with nl LA   History of Present Illness  08/06/2022  Pulmonary/ 1st office eval/ Todd [facility] / [facility]  Chief Complaint  Patient presents with   Consult    Self referral for a cough since April/ May 2023  Dyspnea:  Not limited by breathing from desired activities   Cough: worse p supper,  his largest meal of the day  Sleep: quiets down over night / 2 pillows on side/ flat bed SABA use: none  02: none  Has barrett's but not aware of worse on prilosec '20mg'$  daily   No obvious day to day or daytime pattern/variability or assoc excess/ purulent sputum or mucus plugs or hemoptysis or cp or chest tightness, subjective wheeze or overt sinus or hb symptoms.   Sleeping  without nocturnal  or early am exacerbation  of respiratory  c/o's or need for noct saba. Also denies any obvious fluctuation of symptoms with weather or environmental changes or other aggravating or alleviating factors except as outlined above   No unusual exposure hx or h/o childhood pna  or knowledge of premature birth.  Current Allergies, Complete Past Medical History, Past Surgical History, Family History, and Social History were reviewed in [employer] [facility] record.  ROS  The following are not active complaints unless bolded Hoarseness, sore throat, dysphagia, dental problems, itching, sneezing,  nasal congestion or  discharge of excess mucus or purulent secretions, ear ache,   fever, chills, sweats, unintended wt loss or wt gain, classically pleuritic or exertional cp,  orthopnea pnd or arm/hand swelling  or leg swelling, presyncope, palpitations, abdominal pain, anorexia, nausea, vomiting, diarrhea  or change in bowel habits or change in bladder habits, change in stools or change in urine, dysuria, hematuria,  rash, arthralgias, visual complaints, headache, numbness, weakness or ataxia or problems with walking or coordination,  change in mood or  memory.             Past Medical History:  Diagnosis Date   Arthritis    Barrett esophagus 01/2008   CAD (coronary artery disease)    Mild to moderate nonobstructive disease 2002   Essential hypertension    GERD (gastroesophageal reflux disease)    Hemorrhoids, external    Hyperlipidemia    Osteoarthritis    Schatzki's ring 01/2008   Status post dilatation   Umbilical hernia     Outpatient Medications Prior to Visit  Medication Sig Dispense Refill   amLODipine (NORVASC) 10 MG tablet Take 10 mg by mouth daily.     Ascorbic Acid (VITAMIN C) 1000 MG tablet Take 1,000 mg by mouth daily.     aspirin 81 MG tablet Take 81 mg by mouth daily.     cetirizine (ZYRTEC) 10 MG tablet Take 10 mg by mouth at bedtime.     Cyanocobalamin (  VITAMIN B-12) 5000 MCG TBDP Take 1 tablet by mouth daily.     fish oil-omega-3 fatty acids 1000 MG capsule Take 1 g by mouth daily.     irbesartan-hydrochlorothiazide (AVALIDE) 150-12.5 MG tablet Take 1 tablet by mouth daily.     meloxicam (MOBIC) 7.5 MG tablet Take 7.5 mg by mouth daily.     metoprolol succinate (TOPROL-XL) 25 MG 24 hr tablet Take 25 mg by mouth daily.     omeprazole (PRILOSEC) 20 MG capsule Take 20 mg by mouth daily.     rosuvastatin (CRESTOR) 10 MG tablet Take 10 mg by mouth daily.     Zinc 50 MG TABS Take 1 tablet by mouth daily.     No facility-administered medications prior to visit.     Objective:      BP 122/72 (BP Location: Left Arm)   Pulse 76   Temp 97.7 F (36.5 C)   Ht '5\' 10"'$  (1.778 m)   Wt 187 lb (84.8 kg)   SpO2 97% Comment: ra  BMI 26.83 kg/m   SpO2: 97 % (ra)  Pleasant amb wm nad    HEENT : Oropharynx  clear      Nasal turbinates nl    NECK :  without  apparent JVD/ palpable Nodes/TM    LUNGS: no acc muscle use,  Nl contour chest which is clear to A and P bilaterally without cough on insp or exp maneuvers   CV:  RRR  no s3  2/6 HSM s  increase in P2, and no edema   ABD: abd  soft and nontender with nl inspiratory excursion in the supine position. No bruits or organomegaly appreciated   MS:  Nl gait/ ext warm without deformities Or obvious joint restrictions  calf tenderness, cyanosis or clubbing    SKIN: warm and dry without lesions    NEURO:  alert, approp, nl sensorium with  no motor or cerebellar deficits apparent.    I personally reviewed images and agree with radiology impression as follows:   Chest CT May 24, 2022 1. No suspicious pulmonary nodule identified, with specific attention to the right lung base. Radiographic appearance most likely related to superimposition of shadows and rib end calcification. 2. Normal caliber of the pulmonary arteries by CT. 3. Coronary artery disease. 4. Aortic valve calcification. Correlate for echocardiographic evidence of aortic valve dysfunction.      Assessment   Upper airway cough syndrome vs cough variant asthma Onset spring 2023 in setting of seasonal rhintis and Barrett's  - Allergy screen 08/06/2022 >  Eos 0. /  IgE pending   - FEN0  21/30/8657  38  - cyclical cough rx with tessalon 08/06/2022 >>>   FEN0 is in the higher range of normal with a hx c/w allergic rhinitis and asthma as child so this could be cough variant asthma but note only 50 % improved with prednisone so will hold off trial of ICS and instead focus on cyclical/ upper airway cough that mimicks asthma  Of the three most common causes  of  Sub-acute / recurrent or chronic cough, only one (GERD)  can actually contribute to/ trigger  the other two (asthma and post nasal drip syndrome)  and perpetuate the cylce of cough.  While not intuitively obvious, many patients with chronic low grade reflux do not cough until there is a primary insult that disturbs the protective epithelial barrier and exposes sensitive nerve endings.   This is typically viral but can due to PNDS  and  latter more likely to apply here.   >>>   The point is that once this occurs, it is difficult to eliminate the cycle  using anything but a maximally effective acid suppression regimen at least in the short run, accompanied by an appropriate diet to address non acid GERD and control / eliminate the cough itself for at least 3 days with tessalon pearls  >>> also so added 6 day taper off  Prednisone starting at 40 mg per day in case of component of Th-2 driven upper or lower airways inflammation (if cough responds short term only to relapse before return while will on full rx for uacs (as above), then  that would point to allergic rhinitis/ asthma or eos bronchitis as alternative dx)   Will await allergy screen and  response to above rx before exploring next step which may include an empirical trial of ICS vs methacholine challenge testing and / or allergy referral  Discussed in detail all the  indications, usual  risks and alternatives  relative to the benefits with patient who agrees to proceed with w/u as outlined.            Each maintenance medication was reviewed in detail including emphasizing most importantly the difference between maintenance and prns and under what circumstances the prns are to be triggered using an action plan format where appropriate.  Total time for H and P, chart review, counseling,  and generating customized AVS unique to this office visit / same day charting > 45 min with pt new to me with refactory chronic resp symptoms of unclear  etiology             Christinia Gully, MD 08/06/2022

## 2022-08-10 ENCOUNTER — Other Ambulatory Visit: Payer: Self-pay | Admitting: *Deleted

## 2022-08-10 DIAGNOSIS — K2270 Barrett's esophagus without dysplasia: Secondary | ICD-10-CM

## 2022-08-10 DIAGNOSIS — R1319 Other dysphagia: Secondary | ICD-10-CM

## 2022-08-10 LAB — CBC WITH DIFFERENTIAL/PLATELET
Basophils Absolute: 0 10*3/uL (ref 0.0–0.2)
Basos: 1 %
EOS (ABSOLUTE): 0.4 10*3/uL (ref 0.0–0.4)
Eos: 6 %
Hematocrit: 43.2 % (ref 37.5–51.0)
Hemoglobin: 14.8 g/dL (ref 13.0–17.7)
Immature Grans (Abs): 0 10*3/uL (ref 0.0–0.1)
Immature Granulocytes: 0 %
Lymphocytes Absolute: 2.1 10*3/uL (ref 0.7–3.1)
Lymphs: 34 %
MCH: 31 pg (ref 26.6–33.0)
MCHC: 34.3 g/dL (ref 31.5–35.7)
MCV: 90 fL (ref 79–97)
Monocytes Absolute: 0.7 10*3/uL (ref 0.1–0.9)
Monocytes: 11 %
Neutrophils Absolute: 3 10*3/uL (ref 1.4–7.0)
Neutrophils: 48 %
Platelets: 272 10*3/uL (ref 150–450)
RBC: 4.78 x10E6/uL (ref 4.14–5.80)
RDW: 12.7 % (ref 11.6–15.4)
WBC: 6.2 10*3/uL (ref 3.4–10.8)

## 2022-08-10 LAB — IGE: IgE (Immunoglobulin E), Serum: 336 IU/mL (ref 6–495)

## 2022-08-11 ENCOUNTER — Other Ambulatory Visit: Payer: Self-pay | Admitting: Internal Medicine

## 2022-08-11 DIAGNOSIS — R058 Other specified cough: Secondary | ICD-10-CM

## 2022-08-11 NOTE — Progress Notes (Signed)
Spoke with pt and notified of results per Dr. [name]. Pt verbalized understanding and denied any questions. Pt not improving and will go ahead and place referral to allergy.

## 2022-08-16 ENCOUNTER — Other Ambulatory Visit (HOSPITAL_COMMUNITY)
Admission: RE | Admit: 2022-08-16 | Discharge: 2022-08-16 | Disposition: A | Discharge status: Home / Self Care | Payer: Medicare Other | Source: Ambulatory Visit | Attending: Internal Medicine | Admitting: Internal Medicine

## 2022-08-16 DIAGNOSIS — R1319 Other dysphagia: Secondary | ICD-10-CM | POA: Diagnosis not present

## 2022-08-16 DIAGNOSIS — K2270 Barrett's esophagus without dysplasia: Secondary | ICD-10-CM | POA: Diagnosis not present

## 2022-08-16 LAB — BASIC METABOLIC PANEL
Anion gap: 7 (ref 5–15)
BUN: 16 mg/dL (ref 8–23)
CO2: 26 mmol/L (ref 22–32)
Calcium: 9.2 mg/dL (ref 8.9–10.3)
Chloride: 103 mmol/L (ref 98–111)
Creatinine, Ser: 0.64 mg/dL (ref 0.61–1.24)
GFR, Estimated: >60 mL/min (ref >60)
Glucose, Bld: 114 mg/dL — ABNORMAL HIGH (ref 70–99)
Potassium: 3.9 mmol/L (ref 3.5–5.1)
Sodium: 136 mmol/L (ref 135–145)

## 2022-08-23 ENCOUNTER — Encounter (HOSPITAL_COMMUNITY)
Admission: RE | Disposition: A | Discharge status: Home / Self Care | Payer: Self-pay | Source: Home / Self Care | Attending: Internal Medicine

## 2022-08-23 ENCOUNTER — Other Ambulatory Visit: Payer: Self-pay

## 2022-08-23 ENCOUNTER — Encounter (HOSPITAL_COMMUNITY): Payer: Self-pay | Admitting: Internal Medicine

## 2022-08-23 ENCOUNTER — Ambulatory Visit (HOSPITAL_COMMUNITY)
Admission: RE | Admit: 2022-08-23 | Discharge: 2022-08-23 | Disposition: A | Discharge status: Home / Self Care | Payer: Medicare Other | Attending: Internal Medicine | Admitting: Internal Medicine

## 2022-08-23 ENCOUNTER — Ambulatory Visit (HOSPITAL_BASED_OUTPATIENT_CLINIC_OR_DEPARTMENT_OTHER): Payer: Medicare Other | Admitting: Anesthesiology

## 2022-08-23 ENCOUNTER — Ambulatory Visit (HOSPITAL_COMMUNITY): Payer: Medicare Other | Admitting: Anesthesiology

## 2022-08-23 DIAGNOSIS — K2270 Barrett's esophagus without dysplasia: Secondary | ICD-10-CM | POA: Diagnosis not present

## 2022-08-23 DIAGNOSIS — R1310 Dysphagia, unspecified: Secondary | ICD-10-CM

## 2022-08-23 DIAGNOSIS — I10 Essential (primary) hypertension: Secondary | ICD-10-CM | POA: Diagnosis not present

## 2022-08-23 DIAGNOSIS — K219 Gastro-esophageal reflux disease without esophagitis: Secondary | ICD-10-CM | POA: Diagnosis not present

## 2022-08-23 DIAGNOSIS — Z87891 Personal history of nicotine dependence: Secondary | ICD-10-CM | POA: Diagnosis not present

## 2022-08-23 DIAGNOSIS — I2510 Atherosclerotic heart disease of native coronary artery without angina pectoris: Secondary | ICD-10-CM | POA: Insufficient documentation

## 2022-08-23 DIAGNOSIS — R1319 Other dysphagia: Secondary | ICD-10-CM

## 2022-08-23 DIAGNOSIS — R1314 Dysphagia, pharyngoesophageal phase: Secondary | ICD-10-CM | POA: Insufficient documentation

## 2022-08-23 DIAGNOSIS — K208 Other esophagitis without bleeding: Secondary | ICD-10-CM | POA: Diagnosis not present

## 2022-08-23 DIAGNOSIS — K222 Esophageal obstruction: Secondary | ICD-10-CM | POA: Insufficient documentation

## 2022-08-23 HISTORY — PX: BIOPSY: SHX5522

## 2022-08-23 HISTORY — PX: ESOPHAGOGASTRODUODENOSCOPY (EGD) WITH PROPOFOL: SHX5813

## 2022-08-23 HISTORY — PX: MALONEY DILATION: SHX5535

## 2022-08-23 SURGERY — ESOPHAGOGASTRODUODENOSCOPY (EGD) WITH PROPOFOL
Anesthesia: General

## 2022-08-23 MED ORDER — LACTATED RINGERS IV SOLN: INTRAVENOUS | Status: DC

## 2022-08-23 MED ORDER — PROPOFOL 10 MG/ML IV BOLUS
INTRAVENOUS | Status: DC | PRN
  Administered 2022-08-23 (×3): 50 mg via INTRAVENOUS

## 2022-08-23 NOTE — Op Note (Signed)
[facility] Patient Name: Todd Nelson Procedure Date: 08/23/2022 9:13 AM MRN: [id number] Date of Birth: 05-01-1941 Attending MD: Norvel Richards , MD, [id number] CSN: [id number] Age: 81 Admit Type: Outpatient Procedure:                Upper GI endoscopy Indications:              Dysphagia Providers:                Norvel Richards, MD, Rosina Lowenstein, RN,                            Everardo Pacific Referring MD:              Medicines:                Propofol per Anesthesia Complications:            No immediate complications. Estimated Blood Loss:     Estimated blood loss was minimal. Procedure:                Pre-Anesthesia Assessment:                           - Prior to the procedure, a History and Physical                            was performed, and patient medications and                            allergies were reviewed. The patient's tolerance of                            previous anesthesia was also reviewed. The risks                            and benefits of the procedure and the sedation                            options and risks were discussed with the patient.                            All questions were answered, and informed consent                            was obtained. Prior Anticoagulants: The patient has                            taken no anticoagulant or antiplatelet agents. ASA                            Grade Assessment: II - A patient with mild systemic                            disease. After reviewing the risks and benefits,  the patient was deemed in satisfactory condition to                            undergo the procedure.                           After obtaining informed consent, the endoscope was                            passed under direct vision. Throughout the                            procedure, the patient's blood pressure, pulse, and                            oxygen saturations were  monitored continuously. The                            [id number]-H190 ([id number]) scope was introduced through the                            mouth, and advanced to the second part of duodenum.                            The upper GI endoscopy was accomplished without                            difficulty. The patient tolerated the procedure                            well. Scope In: 9:34:57 AM Scope Out: 9:44:48 AM Total Procedure Duration: 0 hours 9 minutes 51 seconds  Findings:      The examined esophagus was normal aside from 2 very skinny 8 mm tongues       of salmon-colored epithelium coming up from an undulating Z-line. No       nodularity. No esophagitis.      The entire examined stomach was normal.      The duodenal bulb and second portion of the duodenum were normal. The       scope was withdrawn. Dilation was performed with a Maloney dilator with       mild resistance at 56 Fr. The dilation site was examined following       endoscope reinsertion and showed no change. Estimated blood loss: none.       Finally biopsies the abnormal distal esophagus taken for histologic       study. Impression:               Esophagus with tiny tongues of salmon-colored                            epithelium. Status post dilation followed by biopsy.                           - Normal stomach.                           -  Normal duodenal bulb and second portion of the                            duodenum.                           - Moderate Sedation:      Moderate (conscious) sedation was personally administered by an       anesthesia professional. The following parameters were monitored: oxygen       saturation, heart rate, blood pressure, and response to care. Recommendation:           - Patient has a contact number available for                            emergencies. The signs and symptoms of potential                            delayed complications were discussed with the                             patient. Return to normal activities tomorrow.                            Written discharge instructions were provided to the                            patient.                           - Advance diet as tolerated. Continue Protonix.                            Follow-up on pathology. Office visit in 6 months. Procedure Code(s):        --- Professional ---                           [phone], Esophagogastroduodenoscopy, flexible,                            transoral; diagnostic, including collection of                            specimen(s) by brushing or washing, when performed                            (separate procedure)                           43450, Dilation of esophagus, by unguided sound or                            bougie, single or multiple passes Diagnosis Code(s):        --- Professional ---                           R13.10, Dysphagia, unspecified  CPT copyright 2022 American Medical Association. All rights reserved. The codes documented in this report are preliminary and upon coder review may  be revised to meet current compliance requirements. Cristopher Estimable. Korayma Hagwood, MD Norvel Richards, MD 08/23/2022 10:04:55 AM This report has been signed electronically. Number of Addenda: 0

## 2022-08-23 NOTE — H&P (Signed)
$'@LOGO'F$ @   Primary Care Physician:  Glenda Chroman, MD Primary Gastroenterologist:  Dr. Gala Romney  Pre-Procedure History & Physical: HPI:  Todd Nelson is a 81 y.o. male here for  further evaluation of recurrent esophageal dysphagia in the setting of known Schatzki's ring and history of short segment Barrett's esophagus.  Past Medical History:  Diagnosis Date   Arthritis    Barrett esophagus 01/2008   CAD (coronary artery disease)    Mild to moderate nonobstructive disease 2002   Essential hypertension    GERD (gastroesophageal reflux disease)    Hemorrhoids, external    Hyperlipidemia    Osteoarthritis    Schatzki's ring 01/2008   Status post dilatation   Umbilical hernia     Past Surgical History:  Procedure Laterality Date   APPENDECTOMY     BIOPSY  06/27/2019   Procedure: BIOPSY;  Surgeon: Daneil Dolin, MD;  Location: [facility];  Service: Endoscopy;;  esophagus   COLONOSCOPY  2005   hyperplastic polyps,hemorrhoids   COLONOSCOPY N/A 02/04/2014   Latia Mataya: normal. consider one more screening in 10 years   ESOPHAGOGASTRODUODENOSCOPY  02/06/08   RMR: short-segment Barrett's, Schatzki's ring s/p dilation    ESOPHAGOGASTRODUODENOSCOPY  July 2010   RMR: short-segment Barrett's   ESOPHAGOGASTRODUODENOSCOPY N/A 03/02/2016   Fundic gland polyps, Barrett's without dysplasia. Next EGD June 2020.   ESOPHAGOGASTRODUODENOSCOPY N/A 06/27/2019   Procedure: ESOPHAGOGASTRODUODENOSCOPY (EGD);  Surgeon: Daneil Dolin, MD;  Location: [facility];  Service: Endoscopy;  Laterality: N/A;  9:30am   ESOPHAGOGASTRODUODENOSCOPY (EGD) WITH ESOPHAGEAL DILATION  08/21/2012   RMR: noncritical Schatzki ring, small hh, short-segment Barretts with no dysplasia.H.pylori gastritis   KNEE SURGERY  2011   both knees-Bil knee replacement   NECK SURGERY  2008   plate   TONSILLECTOMY      Prior to Admission medications   Medication Sig Start Date End Date Taking? Authorizing Provider  amLODipine (NORVASC)  10 MG tablet Take 10 mg by mouth daily.   Yes [provider]  Ascorbic Acid (VITAMIN C) 1000 MG tablet Take 1,000 mg by mouth daily.   Yes [provider]  aspirin 81 MG tablet Take 81 mg by mouth daily.   Yes [provider]  benzonatate (TESSALON) 200 MG capsule Take 1 capsule (200 mg total) by mouth 3 (three) times daily as needed for cough. 08/06/22  Yes Tanda Rockers, MD  cetirizine (ZYRTEC) 10 MG tablet Take 10 mg by mouth at bedtime. 06/28/22  Yes [provider]  Cyanocobalamin (VITAMIN B-12) 5000 MCG TBDP Take 5,000 mcg by mouth daily.   Yes [provider]  famotidine (PEPCID) 20 MG tablet One after supper 08/06/22  Yes Tanda Rockers, MD  irbesartan-hydrochlorothiazide (AVALIDE) 150-12.5 MG tablet Take 1 tablet by mouth daily.   Yes [provider]  meloxicam (MOBIC) 7.5 MG tablet Take 7.5 mg by mouth daily.   Yes [provider]  metoprolol succinate (TOPROL-XL) 25 MG 24 hr tablet Take 25 mg by mouth daily.   Yes [provider]  pantoprazole (PROTONIX) 40 MG tablet Take 1 tablet (40 mg total) by mouth daily. Take 30-60 min before first meal of the day 08/06/22  Yes Tanda Rockers, MD  rosuvastatin (CRESTOR) 10 MG tablet Take 10 mg by mouth daily. 05/25/22  Yes [provider]  Zinc 50 MG TABS Take 50 mg by mouth daily.   Yes [provider]    Allergies as of 07/14/2022   (  No Known Allergies)    Family History  Problem Relation Age of Onset   Pancreatic cancer Father 50   Hypertension Brother     Social History   Socioeconomic History   Marital status: Married    Spouse name: Not on file   Number of children: Not on file   Years of education: Not on file   Highest education level: Not on file  Occupational History   Occupation: retired    Comment: from [employer]   Occupation: part-time    Comment: [employer]   Tobacco Use   Smoking status: Former    Packs/day: 1.00    Years:  20.00    Total pack years: 20.00    Types: Cigarettes    Start date: 07/24/1954    Quit date: 09/27/1984    Years since quitting: 37.9   Smokeless tobacco: Never  Vaping Use   Vaping Use: Never used  Substance and Sexual Activity   Alcohol use: Not Currently    Comment: 2 beers a day but not daily   Drug use: No   Sexual activity: Yes    Birth control/protection: None  Other Topics Concern   Not on file  Social History Narrative   Not on file   Social Determinants of Health   Financial Resource Strain: Not on file  Food Insecurity: Not on file  Transportation Needs: Not on file  Physical Activity: Not on file  Stress: Not on file  Social Connections: Not on file  Intimate Partner Violence: Not on file    Review of Systems: See HPI, otherwise negative ROS  Physical Exam: BP (!) 146/78   Pulse 72   Temp 98.2 F (36.8 C) (Oral)   Resp 17   SpO2 98%  General:   Alert,  Well-developed, well-nourished, pleasant and cooperative in NAD ge,  or lesions. Mouth:  No deformity or lesions. Neck:  Supple; no masses or thyromegaly. No significant cervical adenopathy. Lungs:  Clear throughout to auscultation.   No wheezes, crackles, or rhonchi. No acute distress. Heart:  Regular rate and rhythm; no murmurs, clicks, rubs,  or gallops. Abdomen: Non-distended, normal bowel sounds.  Soft and nontender without appreciable mass or hepatosplenomegaly.  Pulses:  Normal pulses noted. Extremities:  Without clubbing or edema.  Impression/Plan:    81 year old gentleman with a known Schatzki's ring requiring dilation previously and short segment Barrett's esophagus.  Recurrent esophageal dysphagia recently.  GERD well-controlled.  I have offered the patient a EGD  with esophageal dilation as feasible/appropriate per plan. The risks, benefits, limitations, alternatives and imponderables have been reviewed with the patient. Potential for esophageal dilation, biopsy, etc. have also been reviewed.   Questions have been answered. All parties agreeable.      Notice: This dictation was prepared with Dragon dictation along with smaller phrase technology. Any transcriptional errors that result from this process are unintentional and may not be corrected upon review.

## 2022-08-23 NOTE — Transfer of Care (Signed)
Immediate Anesthesia Transfer of Care Note  Patient: ROSCOE WITTS  Procedure(s) Performed: ESOPHAGOGASTRODUODENOSCOPY (EGD) WITH PROPOFOL [place]  Patient Location: Short Stay  Anesthesia Type:General  Level of Consciousness: awake, alert , oriented, and patient cooperative  Airway & Oxygen Therapy: Patient Spontanous Breathing  Post-op Assessment: Report given to RN, Post -op Vital signs reviewed and stable, and Patient moving all extremities X 4  Post vital signs: Reviewed and stable  Last Vitals:  Vitals Value Taken Time  BP 110/61 08/23/22 0947  Temp 36.4 C 08/23/22 0947  Pulse 64 08/23/22 0947  Resp 24 08/23/22 0947  SpO2 96 % 08/23/22 0947    Last Pain:  Vitals:   08/23/22 0947  TempSrc: Oral  PainSc: 0-No pain         Complications: No notable events documented.

## 2022-08-23 NOTE — Anesthesia Postprocedure Evaluation (Signed)
Anesthesia Post Note  Patient: Todd Nelson  Procedure(s) Performed: ESOPHAGOGASTRODUODENOSCOPY (EGD) WITH PROPOFOL [place]  Patient location during evaluation: Phase II Anesthesia Type: General Level of consciousness: awake and alert and oriented Pain management: pain level controlled Vital Signs Assessment: post-procedure vital signs reviewed and stable Respiratory status: spontaneous breathing, nonlabored ventilation and respiratory function stable Cardiovascular status: blood pressure returned to baseline and stable Postop Assessment: no apparent nausea or vomiting Anesthetic complications: no  No notable events documented.   Last Vitals:  Vitals:   08/23/22 0750 08/23/22 0947  BP: (!) 146/78 110/61  Pulse: 72 64  Resp: 17 (!) 24  Temp: 36.8 C 36.4 C  SpO2: 98% 96%    Last Pain:  Vitals:   08/23/22 0947  TempSrc: Oral  PainSc: 0-No pain                 [name] C Arloa Prak

## 2022-08-23 NOTE — Discharge Instructions (Addendum)
EGD Discharge instructions Please read the instructions outlined below and refer to this sheet in the next few weeks. These discharge instructions provide you with general information on caring for yourself after you leave the hospital. Your doctor may also give you specific instructions. While your treatment has been planned according to the most current medical practices available, unavoidable complications occasionally occur. If you have any problems or questions after discharge, please call your doctor. ACTIVITY You may resume your regular activity but move at a slower pace for the next 24 hours.  Take frequent rest periods for the next 24 hours.  Walking will help expel (get rid of) the air and reduce the bloated feeling in your abdomen.  No driving for 24 hours (because of the anesthesia (medicine) used during the test).  You may shower.  Do not sign any important legal documents or operate any machinery for 24 hours (because of the anesthesia used during the test).  NUTRITION Drink plenty of fluids.  You may resume your normal diet.  Begin with a light meal and progress to your normal diet.  Avoid alcoholic beverages for 24 hours or as instructed by your caregiver.  MEDICATIONS You may resume your normal medications unless your caregiver tells you otherwise.  WHAT YOU CAN EXPECT TODAY You may experience abdominal discomfort such as a feeling of fullness or "gas" pains.  FOLLOW-UP Your doctor will discuss the results of your test with you.  SEEK IMMEDIATE MEDICAL ATTENTION IF ANY OF THE FOLLOWING OCCUR: Excessive nausea (feeling sick to your stomach) and/or vomiting.  Severe abdominal pain and distention (swelling).  Trouble swallowing.  Temperature over 101 F (37.8 C).  Rectal bleeding or vomiting of blood.         Your esophagus was stretched today and biopsied  Further recommendations to follow pending review of pathology report  Your medication could not be reconciled.   There is a question of the correct Zyrtec dose you are supposed to be taking.  Please check with the prescribing physician   office visit with [place] in 6 months    OFFICE WILL CONTACT PATIENT    at patient request, I called [employer] at [phone] -

## 2022-08-23 NOTE — Anesthesia Preprocedure Evaluation (Signed)
Anesthesia Evaluation  Patient identified by MRN, date of birth, ID band Patient awake    Reviewed: Allergy & Precautions, H&P , NPO status , Patient's Chart, lab work & pertinent test results  Airway Mallampati: III  TM Distance: >3 FB Neck ROM: Full    Dental  (+) Dental Advisory Given, Partial Lower   Pulmonary former smoker   Pulmonary exam normal breath sounds clear to auscultation       Cardiovascular hypertension, Pt. on medications + CAD and + Peripheral Vascular Disease  Normal cardiovascular exam Rhythm:Regular Rate:Normal     Neuro/Psych negative neurological ROS  negative psych ROS   GI/Hepatic ,GERD  Medicated and Controlled,,(+)     substance abuse (occasional)  alcohol use  Endo/Other  negative endocrine ROS    Renal/GU negative Renal ROS  negative genitourinary   Musculoskeletal  (+) Arthritis , Osteoarthritis,    Abdominal   Peds negative pediatric ROS (+)  Hematology negative hematology ROS (+)   Anesthesia Other Findings   Reproductive/Obstetrics negative OB ROS                             Anesthesia Physical Anesthesia Plan  ASA: 2  Anesthesia Plan: General   Post-op Pain Management: Minimal or no pain anticipated   Induction: Intravenous  PONV Risk Score and Plan: Propofol infusion  Airway Management Planned: Nasal Cannula and Natural Airway  Additional Equipment:   Intra-op Plan:   Post-operative Plan:   Informed Consent: I have reviewed the patients History and Physical, chart, labs and discussed the procedure including the risks, benefits and alternatives for the proposed anesthesia with the patient or authorized representative who has indicated his/her understanding and acceptance.     Dental advisory given  Plan Discussed with: CRNA and Surgeon  Anesthesia Plan Comments:         Anesthesia Quick Evaluation

## 2022-08-24 LAB — SURGICAL PATHOLOGY

## 2022-08-26 ENCOUNTER — Encounter: Payer: Self-pay | Admitting: Internal Medicine

## 2022-08-27 ENCOUNTER — Ambulatory Visit (INDEPENDENT_AMBULATORY_CARE_PROVIDER_SITE_OTHER): Payer: Medicare Other | Admitting: Allergy & Immunology

## 2022-08-27 ENCOUNTER — Other Ambulatory Visit: Payer: Self-pay

## 2022-08-27 ENCOUNTER — Encounter (HOSPITAL_COMMUNITY): Payer: Self-pay | Admitting: Internal Medicine

## 2022-08-27 VITALS — BP 128/72 | HR 75 | Temp 98.3°F | Resp 17 | Ht 70.0 in | Wt 186.4 lb

## 2022-08-27 DIAGNOSIS — I25119 Atherosclerotic heart disease of native coronary artery with unspecified angina pectoris: Secondary | ICD-10-CM | POA: Diagnosis not present

## 2022-08-27 DIAGNOSIS — K219 Gastro-esophageal reflux disease without esophagitis: Secondary | ICD-10-CM

## 2022-08-27 DIAGNOSIS — R0982 Postnasal drip: Secondary | ICD-10-CM

## 2022-08-27 DIAGNOSIS — J310 Chronic rhinitis: Secondary | ICD-10-CM | POA: Diagnosis not present

## 2022-08-27 DIAGNOSIS — R058 Other specified cough: Secondary | ICD-10-CM | POA: Diagnosis not present

## 2022-08-27 MED ORDER — IPRATROPIUM BROMIDE 0.03 % NA SOLN: 2.0000 | Freq: Three times a day (TID) | NASAL | 5 refills | Status: DC

## 2022-08-27 NOTE — Progress Notes (Signed)
NEW PATIENT  Date of Service/Encounter:  08/27/22  Consult requested by: Glenda Chroman, MD   Assessment:   Gastroesophageal reflux disease  Chronic rhinitis - with negative testing, but NON-REACTIVE positive control (getting lab work today, consider intradermal testing in the future)  Postnasal drip  Elevated FeNO of 38  Upper airway cough syndrome  Plan/Recommendations:   1. Gastroesophageal reflux disease - Continue with your reflux medications as prescribed by Dr. [name]  2. Chronic rhinitis with postnasal drip - Unfortunately, testing could not interpreted since your histamine (positive control) was NOT REACTIVE. - We are going to get some blood work instead. - We will call you with the results. - In the meantime, stop your current nose spray and start ipratropium one spray per nostril up to three times daily. - This can be over drying, so be careful with it. - Hopefully this can correct the postnasal drip and the coughing to follows.   3. Return in about 3 months (around 11/26/2022).   This note in its entirety was forwarded to the Provider who requested this consultation.  Subjective:   Todd Nelson is a 81 y.o. male presenting today for evaluation of  Chief Complaint  Patient presents with   Cough    Todd Nelson has a history of the following: Patient Active Problem List   Diagnosis Date Noted   Upper airway cough syndrome vs cough variant asthma 08/06/2022   Barrett's esophagus without dysplasia    Gastric polyp    PVD 06/04/2009   CAD, NATIVE VESSEL 05/27/2009   GERD 03/20/2009   BARRETTS ESOPHAGUS 03/20/2009   SCHATZKI'S RING, HX OF 03/20/2009   HYPERCHOLESTEROLEMIA 03/19/2009   ALCOHOL USE 03/19/2009   Essential hypertension 03/19/2009   EXTERNAL HEMORRHOIDS 12/45/8099   UMBILICAL HERNIA 83/38/2505   OSTEOARTHRITIS 03/19/2009   TOBACCO USE, QUIT 03/19/2009    History obtained from: chart review and patient.  Todd Nelson was referred  by Glenda Chroman, MD.     Todd Nelson is a 81 y.o. male presenting for an evaluation of environmental allergies .   Asthma/Respiratory Symptom History: He reports that he has a cough since early spring 2023. It is more of a wet cough. He has had no fever with this. He does endorse postnasal drip.  He had an elevated FeNO of 38Prednisone helps as long as he is taking it. Tessalon pearls are hit or miss. Cough gets better with the rain. He had his GERD medications changed. He has not been on anything for his childhood asthma since he was 81 years old or so. He has had one attack in the last 20 years. This current coughing feeling different than asthma.   Allergic Rhinitis Symptom History: He has done nose sprays that provided minimal relief. He is on cetirizine which he has been on for a period of 6 months or so. He was previously on Allegra before that.  He has been on Allegra for a long time, but he has never been tested.  He worked the [employer] before he retired around 2005. He worked there for 28 years. He got three free cases of beer per month. 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  Otherwise, there is no history of other atopic diseases, including drug allergies, stinging insect allergies, eczema, urticaria, or contact dermatitis. There is no significant infectious history. Vaccinations are up to date.    Past Medical History: Patient Active Problem List   Diagnosis Date  Noted   Upper airway cough syndrome vs cough variant asthma 08/06/2022   Barrett's esophagus without dysplasia    Gastric polyp    PVD 06/04/2009   CAD, NATIVE VESSEL 05/27/2009   GERD 03/20/2009   BARRETTS ESOPHAGUS 03/20/2009   SCHATZKI'S RING, HX OF 03/20/2009   HYPERCHOLESTEROLEMIA 03/19/2009   ALCOHOL USE 03/19/2009   Essential hypertension 03/19/2009   EXTERNAL HEMORRHOIDS 32/44/0102   UMBILICAL HERNIA 72/53/6644   OSTEOARTHRITIS 03/19/2009   TOBACCO USE, QUIT 03/19/2009    Medication List:   Allergies as of 08/27/2022   No Known Allergies      Medication List        Accurate as of August 27, 2022  1:01 PM. If you have any questions, ask your nurse or doctor.          STOP taking these medications    famotidine 20 MG tablet Commonly known as: Pepcid Stopped by: Valentina Shaggy, MD       TAKE these medications    amLODipine 10 MG tablet Commonly known as: NORVASC Take 10 mg by mouth daily.   aspirin 81 MG tablet Take 81 mg by mouth daily.   benzonatate 200 MG capsule Commonly known as: TESSALON Take 1 capsule (200 mg total) by mouth 3 (three) times daily as needed for cough.   cetirizine 10 MG tablet Commonly known as: ZYRTEC Take 10 mg by mouth at bedtime.   ipratropium 0.03 % nasal spray Commonly known as: ATROVENT Place 2 sprays into both nostrils 3 (three) times daily. Started by: Valentina Shaggy, MD   irbesartan-hydrochlorothiazide 150-12.5 MG tablet Commonly known as: AVALIDE Take 1 tablet by mouth daily.   meloxicam 7.5 MG tablet Commonly known as: MOBIC Take 7.5 mg by mouth daily.   metoprolol succinate 25 MG 24 hr tablet Commonly known as: TOPROL-XL Take 25 mg by mouth daily.   pantoprazole 40 MG tablet Commonly known as: Protonix Take 1 tablet (40 mg total) by mouth daily. Take 30-60 min before first meal of the day   rosuvastatin 10 MG tablet Commonly known as: CRESTOR Take 10 mg by mouth daily.   Vitamin B-12 5000 MCG Tbdp Take 5,000 mcg by mouth daily.   vitamin C 1000 MG tablet Take 1,000 mg by mouth daily.   Zinc 50 MG Tabs Take 50 mg by mouth daily.        Birth History: non-contributory  Developmental History: non-contributory  Past Surgical History: Past Surgical History:  Procedure Laterality Date   APPENDECTOMY     BIOPSY  06/27/2019   Procedure: BIOPSY;  Surgeon: Daneil Dolin, MD;  [facility];  Service: Endoscopy;;  esophagus   BIOPSY  08/23/2022   Procedure: BIOPSY;   Surgeon: Daneil Dolin, MD;  [facility];  Service: Endoscopy;;   COLONOSCOPY  2005   hyperplastic polyps,hemorrhoids   COLONOSCOPY N/A 02/04/2014   Rourk: normal. consider one more screening in 10 years   ESOPHAGOGASTRODUODENOSCOPY  02/06/08   RMR: short-segment Barrett's, Schatzki's ring s/p dilation    ESOPHAGOGASTRODUODENOSCOPY  July 2010   RMR: short-segment Barrett's   ESOPHAGOGASTRODUODENOSCOPY N/A 03/02/2016   Fundic gland polyps, Barrett's without dysplasia. Next EGD June 2020.   ESOPHAGOGASTRODUODENOSCOPY N/A 06/27/2019   Procedure: ESOPHAGOGASTRODUODENOSCOPY (EGD);  Surgeon: Daneil Dolin, MD;  [facility];  Service: Endoscopy;  Laterality: N/A;  9:30am   ESOPHAGOGASTRODUODENOSCOPY (EGD) WITH ESOPHAGEAL DILATION  08/21/2012   RMR: noncritical Schatzki ring, small hh, short-segment Barretts with  no dysplasia.H.pylori gastritis   ESOPHAGOGASTRODUODENOSCOPY (EGD) WITH PROPOFOL N/A 08/23/2022   Procedure: ESOPHAGOGASTRODUODENOSCOPY (EGD) WITH PROPOFOL;  Surgeon: Daneil Dolin, MD;  [facility];  Service: Endoscopy;  Laterality: N/A;  11:45 am, pt knows to arrive at [facility]   both knees-Bil knee replacement   MALONEY DILATION N/A 08/23/2022   Procedure: Venia Minks DILATION;  Surgeon: Daneil Dolin, MD;  [facility];  Service: Endoscopy;  Laterality: N/A;   NECK SURGERY  2008   plate   TONSILLECTOMY       Family History: Family History  Problem Relation Age of Onset   Pancreatic cancer Father 49   Asthma Brother    Hypertension Brother      Social History: Nadim lives at home with his wife.  Lives in a 46 year old home.  He lives with his wife of 28 years.  There is laminate flooring throughout the home.  They have gas heating and central cooling.  There are cats inside of the home and outside of the home.  There are dust mite covers on the pillows, but not the bed.  There is no tobacco exposure.  They do not  use a HEPA filter.  They are not exposed to fumes, chemicals, or dust.  He worked for [employer] for 28 years and retired in 2005 at the age of 71.  He has children who live throughout [place] with several grandchildren as well.  He spends his day doing yard work for the most part.   Review of Systems  Constitutional: Negative.  Negative for fever, malaise/fatigue and weight loss.  HENT: Negative.  Negative for congestion, ear discharge, ear pain and sinus pain.   Eyes:  Negative for pain, discharge and redness.  Respiratory:  Positive for cough. Negative for sputum production, shortness of breath and wheezing.   Cardiovascular: Negative.  Negative for chest pain and palpitations.  Gastrointestinal:  Positive for heartburn. Negative for abdominal pain, nausea and vomiting.  Skin: Negative.  Negative for itching and rash.  Neurological:  Negative for dizziness and headaches.  Endo/Heme/Allergies:  Positive for environmental allergies. Does not bruise/bleed easily.       Objective:   Blood pressure 128/72, pulse 75, temperature 98.3 F (36.8 C), temperature source Temporal, resp. rate 17, height '5\' 10"'$  (1.778 m), weight 186 lb 6.4 oz (84.6 kg), SpO2 96 %. Body mass index is 26.75 kg/m.     Physical Exam Vitals reviewed.  Constitutional:      Appearance: He is well-developed.     Comments: Hard of hearing.  HENT:     Head: Normocephalic and atraumatic.     Right Ear: Tympanic membrane, ear canal and external ear normal. No drainage, swelling or tenderness. Tympanic membrane is not injected, scarred, erythematous, retracted or bulging.     Left Ear: Tympanic membrane, ear canal and external ear normal. No drainage, swelling or tenderness. Tympanic membrane is not injected, scarred, erythematous, retracted or bulging.     Nose: No nasal deformity, septal deviation, mucosal edema or rhinorrhea.     Right Turbinates: Enlarged, swollen and pale.     Left Turbinates:  Enlarged, swollen and pale.     Right Sinus: No maxillary sinus tenderness or frontal sinus tenderness.     Left Sinus: No maxillary sinus tenderness or frontal sinus tenderness.     Mouth/Throat:     Mouth: Mucous membranes are not pale and not dry.  Pharynx: Uvula midline.  Eyes:     General: Allergic shiner present.        Right eye: No discharge.        Left eye: No discharge.     Conjunctiva/sclera: Conjunctivae normal.     Right eye: Right conjunctiva is not injected. No chemosis.    Left eye: Left conjunctiva is not injected. No chemosis.    Pupils: Pupils are equal, round, and reactive to light.  Cardiovascular:     Rate and Rhythm: Normal rate and regular rhythm.     Heart sounds: Normal heart sounds.  Pulmonary:     Effort: Pulmonary effort is normal. No tachypnea, accessory muscle usage or respiratory distress.     Breath sounds: Normal breath sounds. No wheezing, rhonchi or rales.     Comments: Moving air well in all lung fields.  Chest:     Chest wall: No tenderness.  Abdominal:     Tenderness: There is no abdominal tenderness. There is no guarding or rebound.  Lymphadenopathy:     Head:     Right side of head: No submandibular, tonsillar or occipital adenopathy.     Left side of head: No submandibular, tonsillar or occipital adenopathy.     Cervical: No cervical adenopathy.  Skin:    Coloration: Skin is not pale.     Findings: No abrasion, erythema, petechiae or rash. Rash is not papular, urticarial or vesicular.  Neurological:     Mental Status: He is alert.  Psychiatric:        Behavior: Behavior is cooperative.      Diagnostic studies:    Allergy Studies:     Airborne Adult Perc - 08/27/22 1005     Time Antigen Placed 1005    Allergen Manufacturer Lavella Hammock    Location Back    Number of Test 59    Panel 1 Select    1. Control-Buffer 50% Glycerol Negative    2. Control-Histamine 1 mg/ml Negative   REPEAT HISTAMINE: NEGATIVE   3. Albumin saline  Negative    4. [place] Negative    5. [place] Negative    6. Johnson Negative    7. Kingfisher Blue Negative    8. Meadow Fescue Negative    9. Perennial Rye Negative    10. Sweet Vernal Negative    11. Timothy Negative    12. Cocklebur Negative    13. Burweed Marshelder Negative    14. Ragweed, short Negative    15. Ragweed, Giant Negative    16. Plantain,  English Negative    17. Lamb's Quarters Negative    18. Sheep Sorrell Negative    19. Rough Pigweed Negative    20. Marsh Elder, Rough Negative    21. Mugwort, Common Negative    22. Ash mix Negative    23. Birch mix Negative    24. Beech American Negative    25. Box, Elder Negative    26. Cedar, red Negative    27. Cottonwood, [place] Negative    28. Elm mix Negative    29. Hickory Negative    30. Maple mix Negative    31. Oak, [place] mix Negative    32. Pecan Pollen Negative    33. Pine mix Negative    34. Sycamore Eastern Negative    35. [place], Black Pollen Negative    36. Alternaria alternata Negative    37. Cladosporium Herbarum Negative    38. Aspergillus mix Negative    39. Penicillium  mix Negative    40. Bipolaris sorokiniana (Helminthosporium) Negative    41. Drechslera spicifera (Curvularia) Negative    42. Mucor plumbeus Negative    43. Fusarium moniliforme Negative    44. Aureobasidium pullulans (pullulara) Negative    45. Rhizopus oryzae Negative    46. Botrytis cinera Negative    47. Epicoccum nigrum Negative    48. Phoma betae Negative    49. Candida Albicans Negative    50. Trichophyton mentagrophytes Negative    51. Mite, D Farinae  5,000 AU/ml Negative    52. Mite, D Pteronyssinus  5,000 AU/ml Negative    53. Cat Hair 10,000 BAU/ml Negative    54.  Dog Epithelia Negative    55. Mixed Feathers Negative    56. Horse Epithelia Negative    57. Cockroach, German Negative    58. Mouse Negative    59. Tobacco Leaf Negative             Allergy testing results were read and interpreted by  myself, documented by clinical staff.         Salvatore Marvel, MD [facility] [place]

## 2022-08-27 NOTE — Patient Instructions (Addendum)
1. Gastroesophageal reflux disease - Continue with your reflux medications as prescribed by Dr. [name]  2. Chronic rhinitis with postnasal drip - Unfortunately, testing could not interpreted since your histamine (positive control) was NOT REACTIVE. - We are going to get some blood work instead. - We will call you with the results. - In the meantime, stop your current nose spray and start ipratropium one spray per nostril up to three times daily. - This can be over drying, so be careful with it. - Hopefully this can correct the postnasal drip and the coughing to follows.   3. Return in about 3 months (around 11/26/2022).    Please inform us of any Emergency Department visits, hospitalizations, or changes in symptoms. Call us before going to the ED for breathing or allergy symptoms since we might be able to fit you in for a sick visit. Feel free to contact us anytime with any questions, problems, or concerns.  It was a pleasure to meet you today!  Websites that have reliable patient information: 1. American Academy of Asthma, Allergy, and Immunology: www.aaaai.org 2. Food Allergy Research and Education (FARE): foodallergy.org 3. Mothers of Asthmatics: http://www.asthmacommunitynetwork.org 4. American College of Allergy, Asthma, and Immunology: www.acaai.org   COVID-19 Vaccine Information can be found at: ShippingScam.co.uk For questions related to vaccine distribution or appointments, please email [email or web address] or call [phone].   We realize that you might be concerned about having an allergic reaction to the COVID19 vaccines. To help with that concern, WE ARE OFFERING THE COVID19 VACCINES IN OUR OFFICE! Ask the front desk for dates!     "Like" [place] on Facebook and Instagram for our latest updates!      A healthy democracy works best when [employer] participate! Make sure you are registered to vote! If you have moved  or changed any of your contact information, you will need to get this updated before voting!  In some cases, you MAY be able to register to vote online: [email or web address]      Airborne Adult Perc - 08/27/22 1005     Time Antigen Placed 1005    Allergen Manufacturer [place]    Location Back    Number of Test 59    Panel 1 Select    1. Control-Buffer 50% Glycerol Negative    2. Control-Histamine 1 mg/ml Negative   REPEAT HISTAMINE: NEGATIVE   3. Albumin saline Negative    4. [place] Negative    5. [place] Negative    6. Johnson Negative    7. [place] Blue Negative    8. Meadow Fescue Negative    9. Perennial Rye Negative    10. Sweet Vernal Negative    11. Timothy Negative    12. Cocklebur Negative    13. Burweed Marshelder Negative    14. Ragweed, short Negative    15. Ragweed, Giant Negative    16. Plantain,  English Negative    17. Lamb's Quarters Negative    18. Sheep Sorrell Negative    19. Rough Pigweed Negative    20. Marsh Elder, Rough Negative    21. Mugwort, Common Negative    22. Ash mix Negative    23. Birch mix Negative    24. Beech American Negative    25. Box, Elder Negative    26. Cedar, red Negative    27. Cottonwood, [place] Negative    28. Elm mix Negative    29. Hickory Negative    30. Maple mix Negative    31.  Oak, [place] mix Negative    32. Pecan Pollen Negative    33. Pine mix Negative    34. Sycamore Eastern Negative    35. [place], Black Pollen Negative    36. Alternaria alternata Negative    37. Cladosporium Herbarum Negative    38. Aspergillus mix Negative    39. Penicillium mix Negative    40. Bipolaris sorokiniana (Helminthosporium) Negative    41. Drechslera spicifera (Curvularia) Negative    42. Mucor plumbeus Negative    43. Fusarium moniliforme Negative    44. Aureobasidium pullulans (pullulara) Negative    45. Rhizopus oryzae Negative    46. Botrytis cinera Negative    47. Epicoccum nigrum Negative     48. Phoma betae Negative    49. Candida Albicans Negative    50. Trichophyton mentagrophytes Negative    51. Mite, D Farinae  5,000 AU/ml Negative    52. Mite, D Pteronyssinus  5,000 AU/ml Negative    53. Cat Hair 10,000 BAU/ml Negative    54.  Dog Epithelia Negative    55. Mixed Feathers Negative    56. Horse Epithelia Negative    57. Cockroach, German Negative    58. Mouse Negative    59. Tobacco Leaf Negative

## 2022-09-01 LAB — ALLERGENS W/COMP RFLX AREA 2
Alternaria Alternata IgE: <0.1 kU/L
Aspergillus Fumigatus IgE: <0.1 kU/L
Bermuda Grass IgE: <0.1 kU/L
Cedar, Mountain IgE: <0.1 kU/L
Cladosporium Herbarum IgE: <0.1 kU/L
Cockroach, German IgE: <0.1 kU/L
Common Silver Birch IgE: <0.1 kU/L
Cottonwood IgE: <0.1 kU/L
D Farinae IgE: 0.23 kU/L — AB
D Pteronyssinus IgE: 0.29 kU/L — AB
E001-IgE Cat Dander: <0.1 kU/L
E005-IgE Dog Dander: <0.1 kU/L
Elm, American IgE: <0.1 kU/L
IgE (Immunoglobulin E), Serum: 394 [IU]/mL (ref 6–495)
Johnson Grass IgE: <0.1 kU/L
Maple/Box Elder IgE: <0.1 kU/L
Mouse Urine IgE: <0.1 kU/L
Oak, White IgE: <0.1 kU/L
Pecan, Hickory IgE: <0.1 kU/L
Penicillium Chrysogen IgE: <0.1 kU/L
Pigweed, Rough IgE: <0.1 kU/L
Ragweed, Short IgE: <0.1 kU/L
Sheep Sorrel IgE Qn: <0.1 kU/L
Timothy Grass IgE: <0.1 kU/L
White Mulberry IgE: <0.1 kU/L

## 2022-11-04 DIAGNOSIS — Z0000 Encounter for general adult medical examination without abnormal findings: Secondary | ICD-10-CM | POA: Diagnosis not present

## 2022-11-04 DIAGNOSIS — Z125 Encounter for screening for malignant neoplasm of prostate: Secondary | ICD-10-CM | POA: Diagnosis not present

## 2022-11-04 DIAGNOSIS — D692 Other nonthrombocytopenic purpura: Secondary | ICD-10-CM | POA: Diagnosis not present

## 2022-11-04 DIAGNOSIS — Z299 Encounter for prophylactic measures, unspecified: Secondary | ICD-10-CM | POA: Diagnosis not present

## 2022-11-04 DIAGNOSIS — Z6831 Body mass index (BMI) 31.0-31.9, adult: Secondary | ICD-10-CM | POA: Diagnosis not present

## 2022-11-04 DIAGNOSIS — Z1339 Encounter for screening examination for other mental health and behavioral disorders: Secondary | ICD-10-CM | POA: Diagnosis not present

## 2022-11-04 DIAGNOSIS — R5383 Other fatigue: Secondary | ICD-10-CM | POA: Diagnosis not present

## 2022-11-04 DIAGNOSIS — Z1331 Encounter for screening for depression: Secondary | ICD-10-CM | POA: Diagnosis not present

## 2022-11-04 DIAGNOSIS — I10 Essential (primary) hypertension: Secondary | ICD-10-CM | POA: Diagnosis not present

## 2022-11-04 DIAGNOSIS — I471 Supraventricular tachycardia, unspecified: Secondary | ICD-10-CM | POA: Diagnosis not present

## 2022-11-04 DIAGNOSIS — E7800 Pure hypercholesterolemia, unspecified: Secondary | ICD-10-CM | POA: Diagnosis not present

## 2022-11-04 DIAGNOSIS — Z79899 Other long term (current) drug therapy: Secondary | ICD-10-CM | POA: Diagnosis not present

## 2022-11-04 DIAGNOSIS — Z87891 Personal history of nicotine dependence: Secondary | ICD-10-CM | POA: Diagnosis not present

## 2022-11-04 DIAGNOSIS — Z7189 Other specified counseling: Secondary | ICD-10-CM | POA: Diagnosis not present

## 2022-12-03 ENCOUNTER — Ambulatory Visit: Payer: Medicare Other | Admitting: Allergy & Immunology

## 2022-12-29 DIAGNOSIS — M9902 Segmental and somatic dysfunction of thoracic region: Secondary | ICD-10-CM | POA: Diagnosis not present

## 2022-12-29 DIAGNOSIS — S134XXA Sprain of ligaments of cervical spine, initial encounter: Secondary | ICD-10-CM | POA: Diagnosis not present

## 2022-12-29 DIAGNOSIS — M9901 Segmental and somatic dysfunction of cervical region: Secondary | ICD-10-CM | POA: Diagnosis not present

## 2022-12-29 DIAGNOSIS — M47816 Spondylosis without myelopathy or radiculopathy, lumbar region: Secondary | ICD-10-CM | POA: Diagnosis not present

## 2022-12-29 DIAGNOSIS — M9903 Segmental and somatic dysfunction of lumbar region: Secondary | ICD-10-CM | POA: Diagnosis not present

## 2022-12-29 DIAGNOSIS — S233XXA Sprain of ligaments of thoracic spine, initial encounter: Secondary | ICD-10-CM | POA: Diagnosis not present

## 2022-12-30 ENCOUNTER — Encounter: Payer: Self-pay | Admitting: Internal Medicine

## 2023-02-14 DIAGNOSIS — Z1283 Encounter for screening for malignant neoplasm of skin: Secondary | ICD-10-CM | POA: Diagnosis not present

## 2023-02-14 DIAGNOSIS — L570 Actinic keratosis: Secondary | ICD-10-CM | POA: Diagnosis not present

## 2023-02-14 DIAGNOSIS — D485 Neoplasm of uncertain behavior of skin: Secondary | ICD-10-CM | POA: Diagnosis not present

## 2023-03-01 ENCOUNTER — Ambulatory Visit: Payer: Medicare Other | Admitting: Internal Medicine

## 2023-03-01 ENCOUNTER — Encounter: Payer: Self-pay | Admitting: Gastroenterology

## 2023-03-01 ENCOUNTER — Ambulatory Visit (INDEPENDENT_AMBULATORY_CARE_PROVIDER_SITE_OTHER): Payer: Medicare Other | Admitting: Gastroenterology

## 2023-03-01 VITALS — BP 127/70 | HR 66 | Temp 97.8°F | Ht 70.0 in | Wt 185.5 lb

## 2023-03-01 DIAGNOSIS — K2270 Barrett's esophagus without dysplasia: Secondary | ICD-10-CM | POA: Diagnosis not present

## 2023-03-01 DIAGNOSIS — K219 Gastro-esophageal reflux disease without esophagitis: Secondary | ICD-10-CM | POA: Diagnosis not present

## 2023-03-01 NOTE — Progress Notes (Deleted)
GI Office Note    Referring Provider: Ignatius Specking, MD Primary Care Physician:  Ignatius Specking, MD Primary Gastroenterologist: ***  Date:  03/01/2023  ID:  Todd Nelson, DOB 23-May-1941, MRN [id number]   Chief Complaint   Chief Complaint  Patient presents with   Follow-up    Patient here today for a follow up on Barrett's esophagus. Patient also has a history of Gerd and Engineer, materials. Patient is taking pantoprazole 40 mg once per day and patient says this does control his symptoms. Patient's last EGD was done 08/23/2022 by Dr. Jena Gauss.   History of Present Illness  Todd Nelson is a 82 y.o. Nelson with a history of *** presenting today for follow-up.    EGD 08/23/22: -Esophagus with 20 tongues of salmon-colored epithelium s/p dilation and biopsy -Normal stomach and duodenum -Mucosa with slight inflammation consistent with reflux, no intestinal metaplasia identified -Advised continue Protonix -Future endoscopy not recommended unless new symptoms develop.   Today:  Still having some mild dysphagia with dry foods. No issues with liquids. Doing alright now with pills. If they are not coated then he has trouble. Reflux controlled with pantoprazole. Takes it in the mornings and is good the rest of the day.   Denies N/V, abdominal. Has a great appetite. Weight is maintain at baseline. Has some mild constipation, rare.Does not need anything to help him go. Sometimes does have to strain bu not often. Most of the time stools are soft. No melena or brbpr.    Current Outpatient Medications  Medication Sig Dispense Refill   amLODipine (NORVASC) 10 MG tablet Take 10 mg by mouth daily.     Ascorbic Acid (VITAMIN C) 1000 MG tablet Take 1,000 mg by mouth daily.     aspirin 81 MG tablet Take 81 mg by mouth daily.     cetirizine (ZYRTEC) 10 MG tablet Take 10 mg by mouth at bedtime.     Cyanocobalamin (VITAMIN B-12) 5000 MCG TBDP Take 5,000 mcg by mouth daily.      irbesartan-hydrochlorothiazide (AVALIDE) 150-12.5 MG tablet Take 1 tablet by mouth daily.     meloxicam (MOBIC) 7.5 MG tablet Take 7.5 mg by mouth daily.     metoprolol succinate (TOPROL-XL) 25 MG 24 hr tablet Take 25 mg by mouth daily.     Omega-3 Fatty Acids (FISH OIL) 1000 MG CPDR Take 1,000 mg by mouth daily at 6 (six) AM.     pantoprazole (PROTONIX) 40 MG tablet Take 1 tablet (40 mg total) by mouth daily. Take 30-60 min before first meal of the day 30 tablet 2   rosuvastatin (CRESTOR) 10 MG tablet Take 10 mg by mouth daily.     Zinc 50 MG TABS Take 50 mg by mouth daily.     No current facility-administered medications for this visit.    Past Medical History:  Diagnosis Date   Arthritis    Barrett esophagus 01/2008   CAD (coronary artery disease)    Mild to moderate nonobstructive disease 2002   Essential hypertension    GERD (gastroesophageal reflux disease)    Hemorrhoids, external    Hyperlipidemia    Osteoarthritis    Schatzki's ring 01/2008   Status post dilatation   Umbilical hernia     Past Surgical History:  Procedure Laterality Date   APPENDECTOMY     BIOPSY  06/27/2019   Procedure: BIOPSY;  Surgeon: Corbin Ade, MD;  [facility];  Service: Endoscopy;;  esophagus  BIOPSY  08/23/2022   Procedure: BIOPSY;  Surgeon: Corbin Ade, MD;  [facility];  Service: Endoscopy;;   COLONOSCOPY  2005   hyperplastic polyps,hemorrhoids   COLONOSCOPY N/A 02/04/2014   Rourk: normal. consider one more screening in 10 years   ESOPHAGOGASTRODUODENOSCOPY  02/06/08   RMR: short-segment Barrett's, Schatzki's ring s/p dilation    ESOPHAGOGASTRODUODENOSCOPY  July 2010   RMR: short-segment Barrett's   ESOPHAGOGASTRODUODENOSCOPY N/A 03/02/2016   Fundic gland polyps, Barrett's without dysplasia. Next EGD June 2020.   ESOPHAGOGASTRODUODENOSCOPY N/A 06/27/2019   Procedure: ESOPHAGOGASTRODUODENOSCOPY (EGD);  Surgeon: Corbin Ade, MD;  [facility];   Service: Endoscopy;  Laterality: N/A;  9:30am   ESOPHAGOGASTRODUODENOSCOPY (EGD) WITH ESOPHAGEAL DILATION  08/21/2012   RMR: noncritical Schatzki ring, small hh, short-segment Barretts with no dysplasia.H.pylori gastritis   ESOPHAGOGASTRODUODENOSCOPY (EGD) WITH PROPOFOL N/A 08/23/2022   Procedure: ESOPHAGOGASTRODUODENOSCOPY (EGD) WITH PROPOFOL;  Surgeon: Corbin Ade, MD;  [facility];  Service: Endoscopy;  Laterality: N/A;  11:45 am, pt knows to arrive at 7:45   KNEE SURGERY  2011   both knees-Bil knee replacement   MALONEY DILATION N/A 08/23/2022   Procedure: Elease Hashimoto DILATION;  Surgeon: Corbin Ade, MD;  [facility];  Service: Endoscopy;  Laterality: N/A;   NECK SURGERY  2008   plate   TONSILLECTOMY      Family History  Problem Relation Age of Onset   Pancreatic cancer Father 17   Asthma Brother    Hypertension Brother     Allergies as of 03/01/2023   (No Known Allergies)    Social History   Socioeconomic History   Marital status: Married    Spouse name: Not on file   Number of children: Not on file   Years of education: Not on file   Highest education level: Not on file  Occupational History   Occupation: retired    Comment: from [employer]   Occupation: part-time    Comment: [employer]   Tobacco Use   Smoking status: Former    Packs/day: 1.00    Years: 20.00    Additional pack years: 0.00    Total pack years: 20.00    Types: Cigarettes    Start date: 07/24/1954    Quit date: 09/27/1984    Years since quitting: 38.4   Smokeless tobacco: Never  Vaping Use   Vaping Use: Never used  Substance and Sexual Activity   Alcohol use: Not Currently    Comment: 2 beers a day but not daily   Drug use: No   Sexual activity: Yes    Birth control/protection: None  Other Topics Concern   Not on file  Social History Narrative   Not on file   Social Determinants of Health   Financial Resource Strain: Not on file  Food Insecurity: Not on file   Transportation Needs: Not on file  Physical Activity: Not on file  Stress: Not on file  Social Connections: Not on file     Review of Systems   Gen: Denies fever, chills, anorexia. Denies fatigue, weakness, weight loss.  CV: Denies chest pain, palpitations, syncope, peripheral edema, and claudication. Resp: Denies dyspnea at rest, cough, wheezing, coughing up blood, and pleurisy. GI: See HPI Derm: Denies rash, itching, dry skin Psych: Denies depression, anxiety, memory loss, confusion. No homicidal or suicidal ideation.  Heme: Denies bruising, bleeding, and enlarged lymph nodes.   Physical Exam   BP 127/70 (BP Location: Left Arm, Patient  Position: Sitting, Cuff Size: Large)   Pulse 66   Temp 97.8 F (36.6 C) (Temporal)   Ht 5\' 10"  (1.778 m)   Wt 185 lb 8 oz (84.1 kg)   BMI 26.62 kg/m   General:   Alert and oriented. No distress noted. Pleasant and cooperative.  Head:  Normocephalic and atraumatic. Eyes:  Conjuctiva clear without scleral icterus. Mouth:  Oral mucosa pink and moist. Good dentition. No lesions. Lungs:  Clear to auscultation bilaterally. No wheezes, rales, or rhonchi. No distress.  Heart:  S1, S2 present without murmurs appreciated.  Abdomen:  +BS, soft, non-tender and non-distended. No rebound or guarding. No HSM or masses noted. Rectal: *** Msk:  Symmetrical without gross deformities. Normal posture. Extremities:  Without edema. Neurologic:  Alert and  oriented x4 Psych:  Alert and cooperative. Normal mood and affect.   Assessment  Todd Nelson is a 82 y.o. Nelson with a history of *** presenting today with    PLAN   ***     Brooke Bonito, MSN, FNP-BC, AGACNP-BC [facility]

## 2023-03-01 NOTE — Patient Instructions (Signed)
Continue pantoprazole 40 mg once daily.  It was a pleasure to see you today!  Will plan to follow-up in 1 year, sooner if needed.  If you begin to have any further issues with swallowing please reach out.  It was a pleasure to see you today. I want to create trusting relationships with patients. If you receive a survey regarding your visit,  I greatly appreciate you taking time to fill this out on paper or through your MyChart. I value your feedback.  Brooke Bonito, MSN, FNP-BC, AGACNP-BC [facility]

## 2023-03-01 NOTE — Progress Notes (Signed)
GI Office Note    Referring Provider: Ignatius Specking, MD Primary Care Physician:  Ignatius Specking, MD Primary Gastroenterologist: Gerrit Friends.Rourk, MD  Date:  03/01/2023  ID:  Todd Nelson, DOB 1940-10-04, MRN [id number]   Chief Complaint   Chief Complaint  Patient presents with   Follow-up    Patient here today for a follow up on Barrett's esophagus. Patient also has a history of Gerd and Engineer, materials. Patient is taking pantoprazole 40 mg once per day and patient says this does control his symptoms. Patient's last EGD was done 08/23/2022 by Dr. Jena Gauss.   History of Present Illness  Todd Nelson is a 82 y.o. male with a history of GERD and Barrett's esophagus, multiple prior EGDs, HTN, CAD, HLD, Schatzki's ring s/p dilation, and hemorrhoids presenting today for follow-up of GERD and Barrett's esophagus.  EGD 03/02/2016: -Mucosal changes of the esophagus concerning for Barrett's s/p biopsy -Multiple gastric polyps s/p biopsy -Hiatal hernia -Gastric erythema -Normal second portion of duodenum s/p biopsy -path: Intestinal metaplasia consistent with Barrett's esophagus, fundic gland polyp  EGD September 2020: -Mucosal changes in the esophagus s/p biopsy -Small hiatal hernia -Exam otherwise normal -Normal duodenum -Continue omeprazole 20 mg daily indefinitely -Path: Squamous and glandular epithelium with chronic inflammation -Intestinal metaplasia present -Follow-up in the office in 1 year  EGD 08/23/22: -Esophagus with 20 tongues of salmon-colored epithelium s/p dilation and biopsy -Normal stomach and duodenum -Mucosa with slight inflammation consistent with reflux, no intestinal metaplasia identified -Advised continue Protonix -Future endoscopy not recommended unless new symptoms develop.  Today:  Still having some mild dysphagia with dry foods. No issues with liquids. Doing alright now with pills. If they are not coated then he has trouble. Reflux controlled with  pantoprazole. Takes it in the mornings and is good the rest of the day.   Denies N/V, abdominal. Has a great appetite. Weight is maintain at baseline. Has some mild constipation, rare.Does not need anything to help him go. Sometimes does have to strain bu not often. Most of the time stools are soft. No melena or brbpr.    Current Outpatient Medications  Medication Sig Dispense Refill   amLODipine (NORVASC) 10 MG tablet Take 10 mg by mouth daily.     Ascorbic Acid (VITAMIN C) 1000 MG tablet Take 1,000 mg by mouth daily.     aspirin 81 MG tablet Take 81 mg by mouth daily.     cetirizine (ZYRTEC) 10 MG tablet Take 10 mg by mouth at bedtime.     Cyanocobalamin (VITAMIN B-12) 5000 MCG TBDP Take 5,000 mcg by mouth daily.     irbesartan-hydrochlorothiazide (AVALIDE) 150-12.5 MG tablet Take 1 tablet by mouth daily.     meloxicam (MOBIC) 7.5 MG tablet Take 7.5 mg by mouth daily.     metoprolol succinate (TOPROL-XL) 25 MG 24 hr tablet Take 25 mg by mouth daily.     Omega-3 Fatty Acids (FISH OIL) 1000 MG CPDR Take 1,000 mg by mouth daily at 6 (six) AM.     pantoprazole (PROTONIX) 40 MG tablet Take 1 tablet (40 mg total) by mouth daily. Take 30-60 min before first meal of the day 30 tablet 2   rosuvastatin (CRESTOR) 10 MG tablet Take 10 mg by mouth daily.     Zinc 50 MG TABS Take 50 mg by mouth daily.     No current facility-administered medications for this visit.    Past Medical History:  Diagnosis Date   Arthritis  Barrett esophagus 01/2008   CAD (coronary artery disease)    Mild to moderate nonobstructive disease 2002   Essential hypertension    GERD (gastroesophageal reflux disease)    Hemorrhoids, external    Hyperlipidemia    Osteoarthritis    Schatzki's ring 01/2008   Status post dilatation   Umbilical hernia     Past Surgical History:  Procedure Laterality Date   APPENDECTOMY     BIOPSY  06/27/2019   Procedure: BIOPSY;  Surgeon: Corbin Ade, MD;  [facility];   Service: Endoscopy;;  esophagus   BIOPSY  08/23/2022   Procedure: BIOPSY;  Surgeon: Corbin Ade, MD;  [facility];  Service: Endoscopy;;   COLONOSCOPY  2005   hyperplastic polyps,hemorrhoids   COLONOSCOPY N/A 02/04/2014   Rourk: normal. consider one more screening in 10 years   ESOPHAGOGASTRODUODENOSCOPY  02/06/08   RMR: short-segment Barrett's, Schatzki's ring s/p dilation    ESOPHAGOGASTRODUODENOSCOPY  July 2010   RMR: short-segment Barrett's   ESOPHAGOGASTRODUODENOSCOPY N/A 03/02/2016   Fundic gland polyps, Barrett's without dysplasia. Next EGD June 2020.   ESOPHAGOGASTRODUODENOSCOPY N/A 06/27/2019   Procedure: ESOPHAGOGASTRODUODENOSCOPY (EGD);  Surgeon: Corbin Ade, MD;  [facility];  Service: Endoscopy;  Laterality: N/A;  9:30am   ESOPHAGOGASTRODUODENOSCOPY (EGD) WITH ESOPHAGEAL DILATION  08/21/2012   RMR: noncritical Schatzki ring, small hh, short-segment Barretts with no dysplasia.H.pylori gastritis   ESOPHAGOGASTRODUODENOSCOPY (EGD) WITH PROPOFOL N/A 08/23/2022   Procedure: ESOPHAGOGASTRODUODENOSCOPY (EGD) WITH PROPOFOL;  Surgeon: Corbin Ade, MD;  [facility];  Service: Endoscopy;  Laterality: N/A;  11:45 am, pt knows to arrive at 7:45   KNEE SURGERY  2011   both knees-Bil knee replacement   MALONEY DILATION N/A 08/23/2022   Procedure: Elease Hashimoto DILATION;  Surgeon: Corbin Ade, MD;  [facility];  Service: Endoscopy;  Laterality: N/A;   NECK SURGERY  2008   plate   TONSILLECTOMY      Family History  Problem Relation Age of Onset   Pancreatic cancer Father 5   Asthma Brother    Hypertension Brother     Allergies as of 03/01/2023   (No Known Allergies)    Social History   Socioeconomic History   Marital status: Married    Spouse name: Not on file   Number of children: Not on file   Years of education: Not on file   Highest education level: Not on file  Occupational History   Occupation: retired     Comment: from [employer]   Occupation: part-time    Comment: [employer]   Tobacco Use   Smoking status: Former    Packs/day: 1.00    Years: 20.00    Additional pack years: 0.00    Total pack years: 20.00    Types: Cigarettes    Start date: 07/24/1954    Quit date: 09/27/1984    Years since quitting: 38.4   Smokeless tobacco: Never  Vaping Use   Vaping Use: Never used  Substance and Sexual Activity   Alcohol use: Not Currently    Comment: 2 beers a day but not daily   Drug use: No   Sexual activity: Yes    Birth control/protection: None  Other Topics Concern   Not on file  Social History Narrative   Not on file   Social Determinants of Health   Financial Resource Strain: Not on file  Food Insecurity: Not on file  Transportation Needs: Not on file  Physical  Activity: Not on file  Stress: Not on file  Social Connections: Not on file     Review of Systems   Gen: Denies fever, chills, anorexia. Denies fatigue, weakness, weight loss.  CV: Denies chest pain, palpitations, syncope, peripheral edema, and claudication. Resp: Denies dyspnea at rest, cough, wheezing, coughing up blood, and pleurisy. GI: See HPI Derm: Denies rash, itching, dry skin Psych: Denies depression, anxiety, memory loss, confusion. No homicidal or suicidal ideation.  Heme: Denies bruising, bleeding, and enlarged lymph nodes.   Physical Exam   BP 127/70 (BP Location: Left Arm, Patient Position: Sitting, Cuff Size: Large)   Pulse 66   Temp 97.8 F (36.6 C) (Temporal)   Ht 5\' 10"  (1.778 m)   Wt 185 lb 8 oz (84.1 kg)   BMI 26.62 kg/m   General:   Alert and oriented. No distress noted. Pleasant and cooperative.  Head:  Normocephalic and atraumatic. Eyes:  Conjuctiva clear without scleral icterus. Mouth:  Oral mucosa pink and moist. Good dentition. No lesions. Lungs:  Clear to auscultation bilaterally. No wheezes, rales, or rhonchi. No distress.  Heart:  S1, S2 present without murmurs appreciated.   Abdomen:  +BS, soft, non-tender and non-distended. No rebound or guarding. No HSM or masses noted. Rectal: deferred Msk:  Symmetrical without gross deformities. Normal posture. Extremities:  Without edema. Neurologic:  Alert and  oriented x4 Psych:  Alert and cooperative. Normal mood and affect.   Assessment  Todd Nelson is a 82 y.o. male with a history of GERD and Barrett's esophagus, multiple prior EGDs, HTN, CAD, HLD, Schatzki's ring s/p dilation, and hemorrhoids presenting today for follow-up of GERD and Barrett's esophagus.  GERD/Barrett's esophagus: EGD in 2017 and 2020 with pathologic evidence of Barrett's esophagus.  Has been maintained on PPI once daily.  Most recent EGD for surveillance of Barrett's performed in November 2023 with short segment of salmon-colored mucosa with benign pathology.  Has been maintained on pantoprazole 40 mg once daily.  This is controlling his GERD symptoms well.  He also underwent dilation during last EGD and has been doing well since.  Mostly has issues with dry foods or large pills but overall stable.  PLAN   Continue pantoprazole 40 mg once daily. No need for repeat EGD unless worsening dysphagia symptoms Follow-up in 1 year   Brooke Bonito, MSN, FNP-BC, AGACNP-BC [facility]

## 2023-04-13 DIAGNOSIS — H6123 Impacted cerumen, bilateral: Secondary | ICD-10-CM | POA: Diagnosis not present

## 2023-05-02 ENCOUNTER — Encounter: Payer: Self-pay | Admitting: Nurse Practitioner

## 2023-05-02 ENCOUNTER — Ambulatory Visit: Payer: Medicare Other | Attending: Nurse Practitioner | Admitting: Nurse Practitioner

## 2023-05-02 VITALS — BP 120/68 | HR 64 | Ht 70.5 in | Wt 187.8 lb

## 2023-05-02 DIAGNOSIS — E782 Mixed hyperlipidemia: Secondary | ICD-10-CM | POA: Insufficient documentation

## 2023-05-02 DIAGNOSIS — I2510 Atherosclerotic heart disease of native coronary artery without angina pectoris: Secondary | ICD-10-CM | POA: Insufficient documentation

## 2023-05-02 DIAGNOSIS — H43393 Other vitreous opacities, bilateral: Secondary | ICD-10-CM | POA: Diagnosis not present

## 2023-05-02 DIAGNOSIS — I38 Endocarditis, valve unspecified: Secondary | ICD-10-CM | POA: Insufficient documentation

## 2023-05-02 DIAGNOSIS — I10 Essential (primary) hypertension: Secondary | ICD-10-CM | POA: Diagnosis not present

## 2023-05-02 NOTE — Patient Instructions (Signed)
Medication Instructions:  Your physician recommends that you continue on your current medications as directed. Please refer to the Current Medication list given to you today.   Labwork: None  Testing/Procedures: None  Follow-Up: Your physician recommends that you schedule a follow-up appointment in: 1 year. You will receive a reminder call in about 8 months reminding you to schedule your appointment. If you don't receive this call, please contact our office.   Any Other Special Instructions Will Be Listed Below (If Applicable).  If you need a refill on your cardiac medications before your next appointment, please call your pharmacy.

## 2023-05-02 NOTE — Progress Notes (Signed)
  Cardiology Office Note:  .   Date:  05/02/2023  ID:  Todd Nelson, DOB [birth year], MRN [id number] PCP: Ignatius Specking, MD  [facility] Providers Cardiologist:  Nona Dell, MD    History of Present Illness: .   Todd Nelson is a 82 y.o. male with a PMH of CAD, mixed HLD, HTN, OA, and GERD, who presents today for 1 year follow-up.  Last seen by Dr. Diona Browner on Feb 09, 2022.  He was doing well at that time.  Today he presents for 1 year follow-up.  He states he is doing well. Denies any acute cardiac complaints or issues. Denies any changes to his health since 1 year ago. Denies any chest pain, shortness of breath, palpitations, syncope, presyncope, dizziness, orthopnea, PND, swelling or significant weight changes, acute bleeding, or claudication. Labs from PCP 10/2022 were overall WNL.  SH: Planning on going to [facility] for Labor Day.   Studies Reviewed: Marland Kitchen    EKG:  EKG Interpretation Date/Time:  Monday May 02 2023 14:04:27 EDT Ventricular Rate:  59 PR Interval:  194 QRS Duration:  84 QT Interval:  394 QTC Calculation: 390 R Axis:   37  Text Interpretation: Sinus bradycardia When compared with ECG of 14-Sep-2019 21:39, PREVIOUS ECG IS PRESENT Confirmed by Sharlene Dory [phone]) on 05/02/2023 2:09:22 PM   Echo 05/2022 ([facility]):  Calculated EF 57%.  Mild to moderate MR.  Mild tricuspid regurgitation.  No pericardial effusion observed.  No evidence of pulmonary hypertension.  Lexiscan 10/2020: Blood pressure demonstrated a hypertensive response to exercise. The study is normal. This is a low risk study. The left ventricular ejection fraction is hyperdynamic (>65%). There was no ST segment deviation noted during stress.   Normal resting and stress perfusion. No ischemia or infarction EF 77% Poor functional status only achieved 4.6 METS but did  Reach 90% PMHR   Physical Exam:   VS:  BP 120/68   Pulse 64   Ht 5' 10.5" (1.791 m)   Wt 187 lb 12.8 oz (85.2 kg)    SpO2 94%   BMI 26.57 kg/m    Wt Readings from Last 3 Encounters:  05/02/23 187 lb 12.8 oz (85.2 kg)  03/01/23 185 lb 8 oz (84.1 kg)  08/27/22 186 lb 6.4 oz (84.6 kg)    GEN: Well nourished, well developed in no acute distress NECK: No JVD; No carotid bruits CARDIAC: S1/S2, RRR, no murmurs, rubs, gallops RESPIRATORY:  Clear to auscultation without rales, wheezing or rhonchi  ABDOMEN: Soft, non-tender, non-distended EXTREMITIES:  No edema; No deformity   ASSESSMENT AND PLAN: .    CAD Stable with no anginal symptoms. No indication for ischemic evaluation.  Continue aspirin,irbesartan-hydrochlorothiazide, Toprol XL, and pravastatin. Heart healthy diet and regular cardiovascular exercise encouraged.   Mixed HLD LDL 10/2022 73.  Continue pravastatin. Heart healthy diet and regular cardiovascular exercise encouraged.   HTN Blood pressure stable.  Continue amlodipine, irbesartan-hydrochlorothiazide,and Toprol XL. Discussed to monitor BP at home at least 2 hours after medications and sitting for 5-10 minutes. Heart healthy diet and regular cardiovascular exercise encouraged.   4. Valvular insufficiency Echo obtained from PCPs office 05/2022 revealed mild to moderate MR, mild TR. Pt is asymptomatic. Will repeat TTE in 2-3 years or sooner if clinically indicated.   Dispo: Follow-up with Dr. Diona Browner or APP in 1 year or sooner if anything changes.  Signed, Sharlene Dory, NP

## 2023-05-24 DIAGNOSIS — R053 Chronic cough: Secondary | ICD-10-CM | POA: Diagnosis not present

## 2023-05-24 DIAGNOSIS — I471 Supraventricular tachycardia, unspecified: Secondary | ICD-10-CM | POA: Diagnosis not present

## 2023-05-24 DIAGNOSIS — Z299 Encounter for prophylactic measures, unspecified: Secondary | ICD-10-CM | POA: Diagnosis not present

## 2023-05-24 DIAGNOSIS — I10 Essential (primary) hypertension: Secondary | ICD-10-CM | POA: Diagnosis not present

## 2023-06-07 ENCOUNTER — Ambulatory Visit: Payer: Medicare Other | Admitting: Nurse Practitioner

## 2023-08-17 ENCOUNTER — Other Ambulatory Visit: Payer: Self-pay | Admitting: Internal Medicine

## 2023-08-17 DIAGNOSIS — R058 Other specified cough: Secondary | ICD-10-CM

## 2023-08-17 DIAGNOSIS — Z23 Encounter for immunization: Secondary | ICD-10-CM | POA: Diagnosis not present

## 2023-09-18 DIAGNOSIS — R0981 Nasal congestion: Secondary | ICD-10-CM | POA: Diagnosis not present

## 2023-09-18 DIAGNOSIS — U071 COVID-19: Secondary | ICD-10-CM | POA: Diagnosis not present

## 2023-09-18 DIAGNOSIS — R509 Fever, unspecified: Secondary | ICD-10-CM | POA: Diagnosis not present

## 2023-11-15 DIAGNOSIS — Z79899 Other long term (current) drug therapy: Secondary | ICD-10-CM | POA: Diagnosis not present

## 2023-11-15 DIAGNOSIS — Z1339 Encounter for screening examination for other mental health and behavioral disorders: Secondary | ICD-10-CM | POA: Diagnosis not present

## 2023-11-15 DIAGNOSIS — M79605 Pain in left leg: Secondary | ICD-10-CM | POA: Diagnosis not present

## 2023-11-15 DIAGNOSIS — Z0000 Encounter for general adult medical examination without abnormal findings: Secondary | ICD-10-CM | POA: Diagnosis not present

## 2023-11-15 DIAGNOSIS — E7800 Pure hypercholesterolemia, unspecified: Secondary | ICD-10-CM | POA: Diagnosis not present

## 2023-11-15 DIAGNOSIS — R5383 Other fatigue: Secondary | ICD-10-CM | POA: Diagnosis not present

## 2023-11-15 DIAGNOSIS — Z6831 Body mass index (BMI) 31.0-31.9, adult: Secondary | ICD-10-CM | POA: Diagnosis not present

## 2023-11-15 DIAGNOSIS — Z87891 Personal history of nicotine dependence: Secondary | ICD-10-CM | POA: Diagnosis not present

## 2023-11-15 DIAGNOSIS — Z299 Encounter for prophylactic measures, unspecified: Secondary | ICD-10-CM | POA: Diagnosis not present

## 2023-11-15 DIAGNOSIS — Z1331 Encounter for screening for depression: Secondary | ICD-10-CM | POA: Diagnosis not present

## 2023-11-15 DIAGNOSIS — Z7189 Other specified counseling: Secondary | ICD-10-CM | POA: Diagnosis not present

## 2023-11-15 DIAGNOSIS — Z125 Encounter for screening for malignant neoplasm of prostate: Secondary | ICD-10-CM | POA: Diagnosis not present

## 2023-11-15 DIAGNOSIS — I10 Essential (primary) hypertension: Secondary | ICD-10-CM | POA: Diagnosis not present

## 2023-11-15 DIAGNOSIS — M79604 Pain in right leg: Secondary | ICD-10-CM | POA: Diagnosis not present

## 2023-11-21 DIAGNOSIS — M79604 Pain in right leg: Secondary | ICD-10-CM | POA: Diagnosis not present

## 2024-01-17 ENCOUNTER — Encounter: Payer: Self-pay | Admitting: *Deleted

## 2024-01-24 ENCOUNTER — Telehealth: Payer: Self-pay | Admitting: Internal Medicine

## 2024-01-24 DIAGNOSIS — I10 Essential (primary) hypertension: Secondary | ICD-10-CM | POA: Diagnosis not present

## 2024-01-24 DIAGNOSIS — M79605 Pain in left leg: Secondary | ICD-10-CM | POA: Diagnosis not present

## 2024-01-24 DIAGNOSIS — Z299 Encounter for prophylactic measures, unspecified: Secondary | ICD-10-CM | POA: Diagnosis not present

## 2024-01-24 DIAGNOSIS — M79604 Pain in right leg: Secondary | ICD-10-CM | POA: Diagnosis not present

## 2024-01-24 NOTE — Telephone Encounter (Signed)
 Patient called the office to say he received a recall letter to have a colonoscopy.  He said the last one that was done he was told by the Dr. Annitta Kindler because of his age that would be his last one.  The patient called in to see why he received a recall letter.

## 2024-01-31 ENCOUNTER — Encounter: Payer: Self-pay | Admitting: Gastroenterology

## 2024-02-13 DIAGNOSIS — D3617 Benign neoplasm of peripheral nerves and autonomic nervous system of trunk, unspecified: Secondary | ICD-10-CM | POA: Diagnosis not present

## 2024-02-13 DIAGNOSIS — L821 Other seborrheic keratosis: Secondary | ICD-10-CM | POA: Diagnosis not present

## 2024-02-13 DIAGNOSIS — L570 Actinic keratosis: Secondary | ICD-10-CM | POA: Diagnosis not present

## 2024-04-02 ENCOUNTER — Ambulatory Visit (INDEPENDENT_AMBULATORY_CARE_PROVIDER_SITE_OTHER): Admitting: Internal Medicine

## 2024-04-02 ENCOUNTER — Encounter: Payer: Self-pay | Admitting: Internal Medicine

## 2024-04-02 VITALS — BP 136/68 | HR 59 | Temp 97.7°F | Ht 70.0 in | Wt 185.2 lb

## 2024-04-02 DIAGNOSIS — K219 Gastro-esophageal reflux disease without esophagitis: Secondary | ICD-10-CM | POA: Diagnosis not present

## 2024-04-02 DIAGNOSIS — K2270 Barrett's esophagus without dysplasia: Secondary | ICD-10-CM | POA: Diagnosis not present

## 2024-04-02 NOTE — Patient Instructions (Signed)
 It was good to see you again today!  As discussed, continue pantoprazole  40 mg every day  Given your last colonoscopy was normal in 2015, another examination is not recommended unless new symptoms develop  Plan for 1 more upper endoscopy to look at your Barrett's esophagus in 1 year.  If you have any interim problems please let me know  We will plan to see you back in the office in 1 year

## 2024-04-02 NOTE — Progress Notes (Unsigned)
 Primary Care Physician:  Rosamond Leta NOVAK, MD Primary Gastroenterologist:  Dr. Shaaron  Pre-Procedure History & Physical: HPI:  Todd Nelson is a 83 y.o. male here for follow-up of GERD/short segment Barrett's.  He has done well reflux well-controlled on pantoprazole  40 mg daily.  Has not had any significant recurrent dysphagia since his esophagus was dilated previously.  Bowels are good.  Negative colonoscopy 2015. Dysphagia improved since Schatzki's ring dilated previously.  He spends his days taking care of his younger brother who has advanced dementia.  He has not been hospitalized/no surgeries in the past year  Past Medical History:  Diagnosis Date   Arthritis    Barrett esophagus 01/2008   CAD (coronary artery disease)    Mild to moderate nonobstructive disease 2002   Essential hypertension    GERD (gastroesophageal reflux disease)    Hemorrhoids, external    Hyperlipidemia    Osteoarthritis    Schatzki's ring 01/2008   Status post dilatation   Umbilical hernia     Past Surgical History:  Procedure Laterality Date   APPENDECTOMY     BIOPSY  06/27/2019   Procedure: BIOPSY;  Surgeon: Shaaron Lamar HERO, MD;  [facility]: [facility];  Service: Endoscopy;;  esophagus   BIOPSY  08/23/2022   Procedure: BIOPSY;  Surgeon: Shaaron Lamar HERO, MD;  [facility]: [facility];  Service: Endoscopy;;   COLONOSCOPY  2005   hyperplastic polyps,hemorrhoids   COLONOSCOPY N/A 02/04/2014   Makenly [place]: normal. consider one more screening in 10 years   ESOPHAGOGASTRODUODENOSCOPY  02/06/08   RMR: short-segment Barrett's, Schatzki's ring s/p dilation    ESOPHAGOGASTRODUODENOSCOPY  July 2010   RMR: short-segment Barrett's   ESOPHAGOGASTRODUODENOSCOPY N/A 03/02/2016   Fundic gland polyps, Barrett's without dysplasia. Next EGD June 2020.   ESOPHAGOGASTRODUODENOSCOPY N/A 06/27/2019   Procedure: ESOPHAGOGASTRODUODENOSCOPY (EGD);  Surgeon: Shaaron Lamar HERO, MD;  [facility]: [facility];  Service: Endoscopy;   Laterality: N/A;  9:30am   ESOPHAGOGASTRODUODENOSCOPY (EGD) WITH ESOPHAGEAL DILATION  08/21/2012   RMR: noncritical Schatzki ring, small hh, short-segment Barretts with no dysplasia.H.pylori gastritis   ESOPHAGOGASTRODUODENOSCOPY (EGD) WITH PROPOFOL  N/A 08/23/2022   Procedure: ESOPHAGOGASTRODUODENOSCOPY (EGD) WITH PROPOFOL ;  Surgeon: Shaaron Lamar HERO, MD;  [facility]: [facility];  Service: Endoscopy;  Laterality: N/A;  11:45 am, pt knows to arrive at 7:45   KNEE SURGERY  2011   both knees-Bil knee replacement   MALONEY DILATION N/A 08/23/2022   Procedure: AGAPITO DILATION;  Surgeon: Shaaron Lamar HERO, MD;  [facility]: [facility];  Service: Endoscopy;  Laterality: N/A;   NECK SURGERY  2008   plate   TONSILLECTOMY      Prior to Admission medications   Medication Sig Start Date End Date Taking? Authorizing Provider  amLODipine (NORVASC) 10 MG tablet Take 10 mg by mouth daily.   Yes [provider]  Ascorbic Acid (VITAMIN C) 1000 MG tablet Take 1,000 mg by mouth daily.   Yes [provider]  aspirin 81 MG tablet Take 81 mg by mouth daily.   Yes [provider]  cetirizine (ZYRTEC) 10 MG tablet Take 10 mg by mouth at bedtime. 06/28/22  Yes [provider]  Cyanocobalamin (VITAMIN B-12) 5000 MCG TBDP Take 5,000 mcg by mouth daily.   Yes [provider]  irbesartan-hydrochlorothiazide  (AVALIDE) 150-12.5 MG tablet Take 1 tablet by mouth daily.   Yes [provider]  meloxicam (MOBIC) 7.5 MG tablet Take 7.5 mg by mouth daily.   Yes [provider]  metoprolol succinate (TOPROL-XL) 25 MG 24 hr tablet Take 25 mg by mouth daily.   Yes [provider]  Omega-3 Fatty Acids (FISH OIL) 1000 MG CPDR Take 1,000 mg by mouth daily at 6 (six) AM.   Yes [provider]  pantoprazole  (PROTONIX ) 40 MG tablet Take 1 tablet (40 mg total) by mouth daily. Take 30-60 min before first meal of the day 08/06/22  Yes Darlean Ozell NOVAK, MD   pravastatin (PRAVACHOL) 20 MG tablet Take 20 mg by mouth daily.   Yes [provider]  Zinc 50 MG TABS Take 50 mg by mouth daily.   Yes [provider]    Allergies as of 04/02/2024   (No Known Allergies)    Family History  Problem Relation Age of Onset   Pancreatic cancer Father 53   Asthma Brother    Hypertension Brother     Social History   Socioeconomic History   Marital status: Married    Spouse name: Not on file   Number of children: Not on file   Years of education: Not on file   Highest education level: Not on file  Occupational History   Occupation: retired    Comment: from [employer]   Occupation: part-time    Comment: [employer]   Tobacco Use   Smoking status: Former    Current packs/day: 0.00    Average packs/day: 1 pack/day for 30.2 years (30.2 ttl pk-yrs)    Types: Cigarettes    Start date: 07/24/1954    Quit date: 09/27/1984    Years since quitting: 39.5   Smokeless tobacco: Never  Vaping Use   Vaping status: Never Used  Substance and Sexual Activity   Alcohol  use: Not Currently    Comment: 2 beers a day but not daily   Drug use: No   Sexual activity: Yes    Birth control/protection: None  Other Topics Concern   Not on file  Social History Narrative   Not on file   Social Drivers of Health   Financial Resource Strain: Not on file  Food Insecurity: Not on file  Transportation Needs: Not on file  Physical Activity: Not on file  Stress: Not on file  Social Connections: Not on file  Intimate Partner Violence: Not on file    Review of Systems: See HPI, otherwise negative ROS  Physical Exam: BP 136/68 (BP Location: Right Arm, Patient Position: Sitting, Cuff Size: Normal)   Pulse (!) 59   Temp 97.7 F (36.5 C) (Oral)   Ht 5' 10 (1.778 m)   Wt 185 lb 3.2 oz (84 kg)   SpO2 96%   BMI 26.57 kg/m  General:   Alert,  Well-developed, well-nourished, pleasant and cooperative in NAD Abdomen: Non-distended, normal bowel sounds.  Soft  and nontender without appreciable mass or hepatosplenomegaly.   Impression/Plan: Pleasant 83 year old gentleman with longstanding GERD well-controlled on once daily PPI;  short segment Barrett's with no dysplasia.  Last EGD 2023. Will consider 1 more EGD in 2026  Recommendations:  As discussed, continue pantoprazole  40 mg every day  Given your last colonoscopy was normal in 2015, another examination is not recommended unless new symptoms develop  Plan for 1 more upper endoscopy to look at your Barrett's esophagus in 1 year.  If you have any interim problems please let me know  We will plan to see you back in the office in 1 year      Notice: This dictation was prepared with Dragon dictation  along with smaller phrase technology. Any transcriptional errors that result from this process are unintentional and may not be corrected upon review.

## 2024-04-30 DIAGNOSIS — R093 Abnormal sputum: Secondary | ICD-10-CM | POA: Diagnosis not present

## 2024-04-30 DIAGNOSIS — J069 Acute upper respiratory infection, unspecified: Secondary | ICD-10-CM | POA: Diagnosis not present

## 2024-04-30 DIAGNOSIS — Z299 Encounter for prophylactic measures, unspecified: Secondary | ICD-10-CM | POA: Diagnosis not present

## 2024-04-30 DIAGNOSIS — R059 Cough, unspecified: Secondary | ICD-10-CM | POA: Diagnosis not present

## 2024-05-10 DIAGNOSIS — H9193 Unspecified hearing loss, bilateral: Secondary | ICD-10-CM | POA: Diagnosis not present

## 2024-05-10 DIAGNOSIS — H6123 Impacted cerumen, bilateral: Secondary | ICD-10-CM | POA: Diagnosis not present

## 2024-06-12 ENCOUNTER — Telehealth: Payer: Self-pay | Admitting: *Deleted

## 2024-06-12 NOTE — Telephone Encounter (Signed)
 Pt called and said he was told if he was still having issues swallowing to call back and he would be set up with EGD. Please advise. Thank you

## 2024-06-18 ENCOUNTER — Encounter: Payer: Self-pay | Admitting: *Deleted

## 2024-06-18 NOTE — Telephone Encounter (Signed)
 Pt has been scheduled for 06/27/24. Instructions sent via mychart.

## 2024-06-22 ENCOUNTER — Encounter (HOSPITAL_COMMUNITY): Payer: Self-pay

## 2024-06-22 ENCOUNTER — Other Ambulatory Visit: Payer: Self-pay

## 2024-06-22 ENCOUNTER — Encounter (HOSPITAL_COMMUNITY)
Admission: RE | Admit: 2024-06-22 | Discharge: 2024-06-22 | Disposition: A | Discharge status: Home / Self Care | Source: Ambulatory Visit | Attending: Internal Medicine | Admitting: Internal Medicine

## 2024-06-22 NOTE — Pre-Procedure Instructions (Signed)
Attempted pre-op phone call. Left Vm for him to call us back. 

## 2024-06-27 ENCOUNTER — Encounter (HOSPITAL_COMMUNITY): Payer: Self-pay | Admitting: Internal Medicine

## 2024-06-27 ENCOUNTER — Ambulatory Visit (HOSPITAL_COMMUNITY)
Admission: RE | Admit: 2024-06-27 | Discharge: 2024-06-27 | Disposition: A | Discharge status: Home / Self Care | Attending: Internal Medicine | Admitting: Internal Medicine

## 2024-06-27 ENCOUNTER — Encounter (HOSPITAL_COMMUNITY)
Admission: RE | Disposition: A | Discharge status: Home / Self Care | Payer: Self-pay | Source: Home / Self Care | Attending: Internal Medicine

## 2024-06-27 ENCOUNTER — Other Ambulatory Visit: Payer: Self-pay

## 2024-06-27 ENCOUNTER — Ambulatory Visit (HOSPITAL_COMMUNITY): Admitting: Anesthesiology

## 2024-06-27 DIAGNOSIS — I10 Essential (primary) hypertension: Secondary | ICD-10-CM | POA: Insufficient documentation

## 2024-06-27 DIAGNOSIS — I2510 Atherosclerotic heart disease of native coronary artery without angina pectoris: Secondary | ICD-10-CM

## 2024-06-27 DIAGNOSIS — R1314 Dysphagia, pharyngoesophageal phase: Secondary | ICD-10-CM | POA: Insufficient documentation

## 2024-06-27 DIAGNOSIS — K219 Gastro-esophageal reflux disease without esophagitis: Secondary | ICD-10-CM | POA: Diagnosis not present

## 2024-06-27 DIAGNOSIS — K2270 Barrett's esophagus without dysplasia: Secondary | ICD-10-CM | POA: Diagnosis not present

## 2024-06-27 DIAGNOSIS — K449 Diaphragmatic hernia without obstruction or gangrene: Secondary | ICD-10-CM | POA: Diagnosis not present

## 2024-06-27 DIAGNOSIS — K2289 Other specified disease of esophagus: Secondary | ICD-10-CM | POA: Diagnosis not present

## 2024-06-27 DIAGNOSIS — R1310 Dysphagia, unspecified: Secondary | ICD-10-CM

## 2024-06-27 DIAGNOSIS — Z87891 Personal history of nicotine dependence: Secondary | ICD-10-CM | POA: Diagnosis not present

## 2024-06-27 DIAGNOSIS — Z79899 Other long term (current) drug therapy: Secondary | ICD-10-CM | POA: Diagnosis not present

## 2024-06-27 HISTORY — PX: ESOPHAGEAL DILATION: SHX303

## 2024-06-27 HISTORY — PX: ESOPHAGOGASTRODUODENOSCOPY: SHX5428

## 2024-06-27 SURGERY — EGD (ESOPHAGOGASTRODUODENOSCOPY)
Anesthesia: General

## 2024-06-27 MED ORDER — EPHEDRINE SULFATE-NACL 50-0.9 MG/10ML-% IV SOSY
PREFILLED_SYRINGE | INTRAVENOUS | Status: DC | PRN
  Administered 2024-06-27: 5 mg via INTRAVENOUS

## 2024-06-27 MED ORDER — LACTATED RINGERS IV SOLN: INTRAVENOUS | Status: DC

## 2024-06-27 MED ORDER — PROPOFOL 10 MG/ML IV BOLUS
INTRAVENOUS | Status: DC | PRN
  Administered 2024-06-27: 30 mg via INTRAVENOUS
  Administered 2024-06-27: 100 mg via INTRAVENOUS
  Administered 2024-06-27: 150 ug/kg/min via INTRAVENOUS

## 2024-06-27 MED ORDER — LIDOCAINE 2% (20 MG/ML) 5 ML SYRINGE
INTRAMUSCULAR | Status: DC | PRN
  Administered 2024-06-27: 50 mg via INTRAVENOUS

## 2024-06-27 NOTE — H&P (Signed)
 @[facility] @   Gastroenterology Progress Note    Primary Care Physician:  Rosamond Leta NOVAK, MD Primary Gastroenterologist:  Dr.   Pre-Procedure History & Physical: HPI:  Todd Nelson is a 83 y.o. male here for further evaluation of dysphagia via EGD and potential esophageal dilation history of short segment Barrett's with no dysplasia.  Seen recently in the office felt dysphagia was doing well with her, consider 1 more EGD next year.  However patient thought about it and called in stating he really wanted to have his esophagus stretched again it was very beneficial last time and he starting to have problems.  Reflux well-controlled on pantoprazole  40 mg daily.  Past Medical History:  Diagnosis Date   Arthritis    Barrett esophagus 01/2008   CAD (coronary artery disease)    Mild to moderate nonobstructive disease 2002   Essential hypertension    GERD (gastroesophageal reflux disease)    Hemorrhoids, external    Hyperlipidemia    Osteoarthritis    Schatzki's ring 01/2008   Status post dilatation   Umbilical hernia     Past Surgical History:  Procedure Laterality Date   APPENDECTOMY     BIOPSY  06/27/2019   Procedure: BIOPSY;  Surgeon: Shaaron Lamar HERO, MD;  [facility];  Service: Endoscopy;;  esophagus   BIOPSY  08/23/2022   Procedure: BIOPSY;  Surgeon: Shaaron Lamar HERO, MD;  [facility];  Service: Endoscopy;;   COLONOSCOPY  2005   hyperplastic polyps,hemorrhoids   COLONOSCOPY N/A 02/04/2014   Taariq Leitz: normal. consider one more screening in 10 years   ESOPHAGOGASTRODUODENOSCOPY  02/06/08   RMR: short-segment Barrett's, Schatzki's ring s/p dilation    ESOPHAGOGASTRODUODENOSCOPY  July 2010   RMR: short-segment Barrett's   ESOPHAGOGASTRODUODENOSCOPY N/A 03/02/2016   Fundic gland polyps, Barrett's without dysplasia. Next EGD June 2020.   ESOPHAGOGASTRODUODENOSCOPY N/A 06/27/2019   Procedure: ESOPHAGOGASTRODUODENOSCOPY (EGD);  Surgeon: Shaaron Lamar HERO, MD;  [facility];  Service: Endoscopy;  Laterality: N/A;  9:30am   ESOPHAGOGASTRODUODENOSCOPY (EGD) WITH ESOPHAGEAL DILATION  08/21/2012   RMR: noncritical Schatzki ring, small hh, short-segment Barretts with no dysplasia.H.pylori gastritis   ESOPHAGOGASTRODUODENOSCOPY (EGD) WITH PROPOFOL  N/A 08/23/2022   Procedure: ESOPHAGOGASTRODUODENOSCOPY (EGD) WITH PROPOFOL ;  Surgeon: Shaaron Lamar HERO, MD;  [facility];  Service: Endoscopy;  Laterality: N/A;  11:45 am, pt knows to arrive at 7:45   KNEE SURGERY  2011   both knees-Bil knee replacement   MALONEY DILATION N/A 08/23/2022   Procedure: AGAPITO DILATION;  Surgeon: Shaaron Lamar HERO, MD;  [facility];  Service: Endoscopy;  Laterality: N/A;   NECK SURGERY  2008   plate   TONSILLECTOMY      Prior to Admission medications   Medication Sig Start Date End Date Taking? Authorizing Provider  amLODipine (NORVASC) 10 MG tablet Take 10 mg by mouth daily.   Yes [provider]  Ascorbic Acid (VITAMIN C) 1000 MG tablet Take 1,000 mg by mouth daily.   Yes [provider]  aspirin 81 MG tablet Take 81 mg by mouth daily.   Yes [provider]  cetirizine (ZYRTEC) 10 MG tablet Take 10 mg by mouth at bedtime. 06/28/22  Yes [provider]  Cyanocobalamin (VITAMIN B-12) 5000 MCG TBDP Take 5,000 mcg by mouth daily.   Yes [provider]  irbesartan-hydrochlorothiazide  (AVALIDE) 150-12.5 MG tablet Take 1 tablet by mouth daily.   Yes [provider]  meloxicam (MOBIC) 7.5 MG tablet  Take 7.5 mg by mouth daily.   Yes [provider]  metoprolol succinate (TOPROL-XL) 25 MG 24 hr tablet Take 25 mg by mouth daily.   Yes [provider]  Omega-3 Fatty Acids (FISH OIL) 1000 MG CPDR Take 1,000 mg by mouth daily at 6 (six) AM.   Yes [provider]  pantoprazole  (PROTONIX ) 40 MG tablet Take 1 tablet (40 mg total) by mouth daily. Take 30-60 min before first meal of the day 08/06/22   Yes Darlean Ozell NOVAK, MD  pravastatin (PRAVACHOL) 20 MG tablet Take 20 mg by mouth daily.   Yes [provider]  Zinc 50 MG TABS Take 50 mg by mouth daily.   Yes [provider]    Allergies as of 06/18/2024   (No Known Allergies)    Family History  Problem Relation Age of Onset   Pancreatic cancer Father 71   Asthma Brother    Hypertension Brother     Social History   Socioeconomic History   Marital status: Married    Spouse name: Not on file   Number of children: Not on file   Years of education: Not on file   Highest education level: Not on file  Occupational History   Occupation: retired    Comment: from [employer]   Occupation: part-time    Comment: [employer]   Tobacco Use   Smoking status: Former    Current packs/day: 0.00    Average packs/day: 1 pack/day for 30.2 years (30.2 ttl pk-yrs)    Types: Cigarettes    Start date: 07/24/1954    Quit date: 09/27/1984    Years since quitting: 39.7   Smokeless tobacco: Never  Vaping Use   Vaping status: Never Used  Substance and Sexual Activity   Alcohol  use: Not Currently    Comment: 2 beers a day but not daily   Drug use: No   Sexual activity: Yes    Birth control/protection: None  Other Topics Concern   Not on file  Social History Narrative   Not on file   Social Drivers of Health   Financial Resource Strain: Not on file  Food Insecurity: Not on file  Transportation Needs: Not on file  Physical Activity: Not on file  Stress: Not on file  Social Connections: Not on file  Intimate Partner Violence: Not on file    Review of Systems   See HPI, otherwise negative ROS  Physical Exam: BP 137/61   Pulse (!) 52   Temp 97.8 F (36.6 C) (Oral)   Resp 15   Ht 5' 10 (1.778 m)   Wt 79.8 kg   SpO2 100%   BMI 25.24 kg/m  General:   Alert,  Well-developed, well-nourished, pleasant and cooperative in NAD cal adenopathy. Lungs:  Clear throughout to auscultation.   No wheezes, crackles, or rhonchi. No  acute distress. Heart:  Regular rate and rhythm; no murmurs, clicks, rubs,  or gallops. Abdomen: Non-distended, normal bowel sounds.  Soft and nontender without appreciable mass or hepatosplenomegaly.  Impression/Plan:   83 year old gentleman with short segment Barrett's esophagus and dysphagia.  Nice results 2 years ago with empiric dilation of what appeared to be a normal esophagus.  He notes insidiously recurrent esophageal dysphagia wishes to have his esophagus dilated once again.  Reflux well-controlled.  I have offered him an EGD with esophageal dilation is feasible/appropriate today per plan. The risks, benefits, limitations, alternatives and imponderables have been reviewed with the patient. Potential for esophageal dilation, biopsy,  etc. have also been reviewed.  Questions have been answered. All parties agreeable.     Notice: This dictation was prepared with Dragon dictation along with smaller phrase technology. Any transcriptional errors that result from this process are unintentional and may not be corrected upon review.

## 2024-06-27 NOTE — Anesthesia Postprocedure Evaluation (Signed)
 Anesthesia Post Note  Patient: Todd Nelson  Procedure(s) Performed: EGD (ESOPHAGOGASTRODUODENOSCOPY) DILATION, ESOPHAGUS  Anesthesia Type: General Anesthetic complications: no   No notable events documented.   Last Vitals:  Vitals:   06/27/24 0901 06/27/24 0907  BP: (!) 97/47 (!) 102/55  Pulse: (!) 59   Resp: 19   Temp: 36.5 C   SpO2: 96%     Last Pain:  Vitals:   06/27/24 0901  TempSrc: Oral  PainSc: 0-No pain                 Christyann Manolis

## 2024-06-27 NOTE — Transfer of Care (Signed)
 Immediate Anesthesia Transfer of Care Note  Patient: Todd Nelson  Procedure(s) Performed: EGD (ESOPHAGOGASTRODUODENOSCOPY) DILATION, ESOPHAGUS  Patient Location: Short Stay  Anesthesia Type:General  Level of Consciousness: awake  Airway & Oxygen Therapy: Patient Spontanous Breathing  Post-op Assessment: Report given to RN  Post vital signs: Reviewed and stable  Last Vitals:  Vitals Value Taken Time  BP    Temp    Pulse    Resp    SpO2      Last Pain:  Vitals:   06/27/24 0833  TempSrc:   PainSc: 0-No pain         Complications: No notable events documented.

## 2024-06-27 NOTE — Discharge Instructions (Addendum)
 EGD Discharge instructions Please read the instructions outlined below and refer to this sheet in the next few weeks. These discharge instructions provide you with general information on caring for yourself after you leave the hospital. Your doctor may also give you specific instructions. While your treatment has been planned according to the most current medical practices available, unavoidable complications occasionally occur. If you have any problems or questions after discharge, please call your doctor. ACTIVITY You may resume your regular activity but move at a slower pace for the next 24 hours.  Take frequent rest periods for the next 24 hours.  Walking will help expel (get rid of) the air and reduce the bloated feeling in your abdomen.  No driving for 24 hours (because of the anesthesia (medicine) used during the test).  You may shower.  Do not sign any important legal documents or operate any machinery for 24 hours (because of the anesthesia used during the test).  NUTRITION Drink plenty of fluids.  You may resume your normal diet.  Begin with a light meal and progress to your normal diet.  Avoid alcoholic beverages for 24 hours or as instructed by your caregiver.  MEDICATIONS You may resume your normal medications unless your caregiver tells you otherwise.  WHAT YOU CAN EXPECT TODAY You may experience abdominal discomfort such as a feeling of fullness or "gas" pains.  FOLLOW-UP Your doctor will discuss the results of your test with you.  SEEK IMMEDIATE MEDICAL ATTENTION IF ANY OF THE FOLLOWING OCCUR: Excessive nausea (feeling sick to your stomach) and/or vomiting.  Severe abdominal pain and distention (swelling).  Trouble swallowing.  Temperature over 101 F (37.8 C).  Rectal bleeding or vomiting of blood.     Your esophagus was stretched today.  Biopsies taken.  Continue Protonix  40 mg every day  Further recommendations to follow pending review of pathology report  Office  visit with me in 6 months  At patient request, I called Merlynn Gaskins at [phone] findings and recommendations

## 2024-06-27 NOTE — Op Note (Signed)
 [facility] Patient Name: Todd Nelson Procedure Date: 06/27/2024 8:12 AM MRN: [id number] Date of Birth: Apr 13, 1941 Attending MD: Lamar Ozell Hollingshead , MD, [id number] CSN: [id number] Age: 83 Admit Type: Outpatient Procedure:                Upper GI endoscopy Indications:              Dysphagia Providers:                Lamar Ozell Hollingshead, MD, Jon LABOR. Museum/gallery exhibitions officer, RN,                            Leandrew Edelman RN, RN Referring MD:              Medicines:                Propofol  per Anesthesia Complications:             Estimated Blood Loss:     Estimated blood loss was minimal. Procedure:                Pre-Anesthesia Assessment:                           - Prior to the procedure, a History and Physical                            was performed, and patient medications and                            allergies were reviewed. The patient's tolerance of                            previous anesthesia was also reviewed. The risks                            and benefits of the procedure and the sedation                            options and risks were discussed with the patient.                            All questions were answered, and informed consent                            was obtained. Prior Anticoagulants: The patient has                            taken no anticoagulant or antiplatelet agents. ASA                            Grade Assessment: III - A patient with severe                            systemic disease. After reviewing the risks and  benefits, the patient was deemed in satisfactory                            condition to undergo the procedure.                           After obtaining informed consent, the endoscope was                            passed under direct vision. Throughout the                            procedure, the patient's blood pressure, pulse, and                            oxygen saturations were monitored continuously.  The                            [id number] ([id number]) Upper was introduced through                            the mouth, and advanced to the second part of                            duodenum. The upper GI endoscopy was accomplished                            without difficulty. The patient tolerated the                            procedure well. Scope In: 8:40:25 AM Scope Out: 8:50:01 AM Total Procedure Duration: 0 hours 9 minutes 36 seconds  Findings:      The examined esophagus appeared to have an undulating Z-line which was       somewhat accentuated. Subtly ringed appearance with peristalsis. Not at       all impressive for Barrett's esophagus widely patent appearing tubular       esophagus. The scope was withdrawn. Dilation was performed with a       Maloney dilator with mild resistance at 56 Fr. The dilation site was       examined following endoscope reinsertion and showed no change. Estimated       blood loss: none.      A small hiatal hernia was present.      The second portion of the duodenum was normal. Subsequently, GE junction       biopsied and then the distal third of the esophagus and midesophagus       were biopsied to screen for EOE. Impression:               Subtly abnormal appearing esophagus biopsied as                            described above.                           After dilation                           -  Small hiatal hernia.                           - Normal second portion of the duodenum.                           - No specimens collected. Moderate Sedation:      Moderate (conscious) sedation was personally administered by an       anesthesia professional. The following parameters were monitored: oxygen       saturation, heart rate, blood pressure, respiratory rate, EKG, adequacy       of pulmonary ventilation, and response to care. Recommendation:           - Patient has a contact number available for                            emergencies. The signs  and symptoms of potential                            delayed complications were discussed with the                            patient. Return to normal activities tomorrow.                            Written discharge instructions were provided to the                            patient.                           - Advance diet as tolerated.                           - Continue present medications.                           - Return to my office in 6 months. Follow-up on                            pathology. Procedure Code(s):        --- Professional ---                           [phone], Esophagogastroduodenoscopy, flexible,                            transoral; diagnostic, including collection of                            specimen(s) by brushing or washing, when performed                            (separate procedure)                           43450, Dilation of esophagus, by unguided sound or  bougie, single or multiple passes Diagnosis Code(s):        --- Professional ---                           K44.9, Diaphragmatic hernia without obstruction or                            gangrene                           R13.10, Dysphagia, unspecified CPT copyright 2022 [employer]. All rights reserved. The codes documented in this report are preliminary and upon coder review may  be revised to meet current compliance requirements. Lamar HERO. Jackelyn Illingworth, MD Lamar Ozell Hollingshead, MD 06/27/2024 9:02:49 AM This report has been signed electronically. Number of Addenda: 0

## 2024-06-27 NOTE — Anesthesia Preprocedure Evaluation (Signed)
 Anesthesia Evaluation  Patient identified by MRN, date of birth, ID band Patient awake    Reviewed: Allergy  & Precautions, H&P , NPO status , Patient's Chart, lab work & pertinent test results  Airway Mallampati: III  TM Distance: >3 FB Neck ROM: Full    Dental  (+) Dental Advisory Given, Partial Lower, Missing   Pulmonary former smoker   Pulmonary exam normal breath sounds clear to auscultation       Cardiovascular hypertension, Pt. on medications + CAD and + Peripheral Vascular Disease  Normal cardiovascular exam Rhythm:Regular Rate:Normal     Neuro/Psych negative neurological ROS  negative psych ROS   GI/Hepatic ,GERD  Medicated and Controlled,,(+)     substance abuse (occasional)  alcohol  use  Endo/Other  negative endocrine ROS    Renal/GU negative Renal ROS  negative genitourinary   Musculoskeletal  (+) Arthritis , Osteoarthritis,    Abdominal   Peds negative pediatric ROS (+)  Hematology negative hematology ROS (+)   Anesthesia Other Findings   Reproductive/Obstetrics negative OB ROS                              Anesthesia Physical Anesthesia Plan  ASA: 2  Anesthesia Plan: General   Post-op Pain Management: Minimal or no pain anticipated   Induction: Intravenous  PONV Risk Score and Plan: Propofol  infusion  Airway Management Planned: Nasal Cannula and Natural Airway  Additional Equipment: None  Intra-op Plan:   Post-operative Plan:   Informed Consent: I have reviewed the patients History and Physical, chart, labs and discussed the procedure including the risks, benefits and alternatives for the proposed anesthesia with the patient or authorized representative who has indicated his/her understanding and acceptance.     Dental advisory given  Plan Discussed with: CRNA  Anesthesia Plan Comments:          Anesthesia Quick Evaluation

## 2024-06-28 ENCOUNTER — Ambulatory Visit: Payer: Self-pay | Admitting: Internal Medicine

## 2024-06-28 ENCOUNTER — Encounter (HOSPITAL_COMMUNITY): Payer: Self-pay | Admitting: Internal Medicine

## 2024-06-28 LAB — SURGICAL PATHOLOGY

## 2024-06-29 DIAGNOSIS — H43391 Other vitreous opacities, right eye: Secondary | ICD-10-CM | POA: Diagnosis not present

## 2024-07-04 ENCOUNTER — Ambulatory Visit: Attending: Cardiology | Admitting: Cardiology

## 2024-07-04 ENCOUNTER — Encounter: Payer: Self-pay | Admitting: Cardiology

## 2024-07-04 VITALS — BP 118/68 | HR 68 | Ht 70.5 in | Wt 180.6 lb

## 2024-07-04 DIAGNOSIS — R0609 Other forms of dyspnea: Secondary | ICD-10-CM | POA: Insufficient documentation

## 2024-07-04 DIAGNOSIS — E782 Mixed hyperlipidemia: Secondary | ICD-10-CM | POA: Insufficient documentation

## 2024-07-04 DIAGNOSIS — I340 Nonrheumatic mitral (valve) insufficiency: Secondary | ICD-10-CM | POA: Insufficient documentation

## 2024-07-04 DIAGNOSIS — I2510 Atherosclerotic heart disease of native coronary artery without angina pectoris: Secondary | ICD-10-CM | POA: Diagnosis not present

## 2024-07-04 DIAGNOSIS — I10 Essential (primary) hypertension: Secondary | ICD-10-CM | POA: Diagnosis not present

## 2024-07-04 MED ORDER — ROSUVASTATIN CALCIUM 20 MG PO TABS: 20.0000 mg | ORAL_TABLET | Freq: Every day | ORAL | 3 refills | Status: AC

## 2024-07-04 NOTE — Progress Notes (Signed)
    Cardiology Office Note  Date: 07/04/2024   ID: D'Arcy, Abraha 1940-12-24, MRN [id number]  History of Present Illness: Todd Nelson is an 83 y.o. male last seen in August 2024 by Ms. Miriam NP, I reviewed her note.  Our last visit was in 2023.  He is here for a routine visit.  He tells me that over the last 3 to 4 months he has been experiencing more dyspnea on exertion with typical activities, sometimes has to stop and rest.  No chest tightness or pressure, no palpitations or syncope.  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  We went over his medications.  He reports compliance with therapy.  I went over his lab work from February at which point LDL was 98.  His blood pressure is well-controlled today.  I reviewed his ECG today which shows sinus rhythm with PVC.  No ischemic testing since 2022.  Physical Exam: VS:  BP 118/68 (BP Location: Left Arm)   Pulse 68   Ht 5' 10.5 (1.791 m)   Wt 180 lb 9.6 oz (81.9 kg)   SpO2 95%   BMI 25.55 kg/m , BMI Body mass index is 25.55 kg/m.  Wt Readings from Last 3 Encounters:  07/04/24 180 lb 9.6 oz (81.9 kg)  06/27/24 175 lb 14.5 oz (79.8 kg)  06/22/24 175 lb (79.4 kg)    General: Patient appears comfortable at rest. HEENT: Conjunctiva and lids normal. Neck: Supple, no elevated JVP or carotid bruits. Lungs: Clear to auscultation, nonlabored breathing at rest. Cardiac: Regular rate and rhythm, no S3, 1/6 systolic murmur. Extremities: No pitting edema.  ECG:  An ECG dated 05/02/2023 was personally reviewed today and demonstrated:  Sinus rhythm.  Labwork:  February 2025: Hemoglobin 15.5, platelets 245, BUN 17, creatinine 0.61, GFR 96, potassium 4.3, AST 20, ALT 20, cholesterol 163, triglycerides 46, HDL 55, LDL 98, TSH 2.04  Other Studies Reviewed Today:  No interval cardiac testing for review today.  Assessment and Plan:  1.  CAD, mild to moderate and  nonobstructive by evaluation in 2002.  Follow-up exercise Myoview  in 2022 showed no active ischemia with LVEF greater than 65%.  Now reporting increasing dyspnea on exertion as discussed above.  ECG reviewed.  Plan to obtain a Lexiscan  Myoview  for follow-up ischemic evaluation.  For now continue aspirin 81 mg daily and statin therapy.  2.  Mixed hyperlipidemia.  LDL 98 and HDL 55 in February.  Discussed intensification of treatment in the setting of vascular disease.  Stopping Pravachol with switch to Crestor 20 mg daily.  3.  Primary hypertension.  Blood pressure is well-controlled today.  Continue Toprol-XL 25 mg daily, Avalide 150/12.5 mg daily, and Norvasc 10 mg daily.  4.  Mitral regurgitation, mild to moderate by echocardiogram at [facility] in 2023.  Follow-up echocardiogram in light of dyspnea on exertion to reassess LVEF and degree of valvular disease.  Disposition:  Follow up test results.  Signed, Jayson JUDITHANN Sierras, M.D., F.A.C.C. [facility]

## 2024-07-04 NOTE — Patient Instructions (Signed)
 Medication Instructions:  STOP Pravastatin   START Crestor (rosuvastatin)  20 mg daily  Labwork: None today  Testing/Procedures: Your physician has requested that you have an echocardiogram. Echocardiography is a painless test that uses sound waves to create images of your heart. It provides your doctor with information about the size and shape of your heart and how well your heart's chambers and valves are working. This procedure takes approximately one hour. There are no restrictions for this procedure. Please do NOT wear cologne, perfume, aftershave, or lotions (deodorant is allowed). Please arrive 15 minutes prior to your appointment time.  Please note: We ask at that you not bring children with you during ultrasound (echo/ vascular) testing. Due to room size and safety concerns, children are not allowed in the ultrasound rooms during exams. Our front office staff cannot provide observation of children in our lobby area while testing is being conducted. An adult accompanying a patient to their appointment will only be allowed in the ultrasound room at the discretion of the ultrasound technician under special circumstances. We apologize for any inconvenience.    Your physician has requested that you have a lexiscan  myoview . For further information please visit [email or web address] Please follow instruction sheet, as given.   Follow-Up: To be determined after testing  Any Other Special Instructions Will Be Listed Below (If Applicable).  If you need a refill on your cardiac medications before your next appointment, please call your pharmacy.

## 2024-07-11 ENCOUNTER — Encounter (HOSPITAL_COMMUNITY): Payer: Self-pay

## 2024-07-12 ENCOUNTER — Encounter (HOSPITAL_BASED_OUTPATIENT_CLINIC_OR_DEPARTMENT_OTHER)
Admission: RE | Admit: 2024-07-12 | Discharge: 2024-07-12 | Disposition: A | Discharge status: Home / Self Care | Source: Ambulatory Visit | Attending: Cardiology | Admitting: Cardiology

## 2024-07-12 ENCOUNTER — Ambulatory Visit (HOSPITAL_COMMUNITY)
Admission: RE | Admit: 2024-07-12 | Discharge: 2024-07-12 | Disposition: A | Discharge status: Home / Self Care | Source: Ambulatory Visit | Attending: Cardiology | Admitting: Cardiology

## 2024-07-12 ENCOUNTER — Telehealth: Payer: Self-pay | Admitting: Student

## 2024-07-12 ENCOUNTER — Ambulatory Visit: Payer: Self-pay | Admitting: Cardiology

## 2024-07-12 DIAGNOSIS — I2510 Atherosclerotic heart disease of native coronary artery without angina pectoris: Secondary | ICD-10-CM | POA: Insufficient documentation

## 2024-07-12 DIAGNOSIS — R0609 Other forms of dyspnea: Secondary | ICD-10-CM | POA: Diagnosis not present

## 2024-07-12 LAB — NM MYOCAR MULTI W/SPECT W/WALL MOTION / EF
Base ST Depression (mm): 0 mm
Estimated workload: 1
Exercise duration (min): 0 min
Exercise duration (sec): 0 s
LV dias vol: 81 mL (ref 62–150)
LV sys vol: 24 mL (ref 4.2–5.8)
MPHR: 138 {beats}/min
Nuc Stress EF: 70 %
Peak HR: 87 {beats}/min
Percent HR: 63 %
RATE: 0.3
Rest HR: 60 {beats}/min
Rest Nuclear Isotope Dose: 10.7 mCi
SDS: 0
SRS: 1
SSS: 1
ST Depression (mm): 0 mm
Stress Nuclear Isotope Dose: 32.1 mCi
TID: 1.2

## 2024-07-12 MED ORDER — TECHNETIUM TC 99M TETROFOSMIN IV KIT
32.1000 | PACK | Freq: Once | INTRAVENOUS | Status: AC | PRN
  Administered 2024-07-12: 32.1 via INTRAVENOUS

## 2024-07-12 MED ORDER — REGADENOSON 0.4 MG/5ML IV SOLN
INTRAVENOUS | Status: AC
  Administered 2024-07-12: 0.4 mg via INTRAVENOUS
  Filled 2024-07-12: qty 5

## 2024-07-12 MED ORDER — TECHNETIUM TC 99M TETROFOSMIN IV KIT
10.7000 | PACK | Freq: Once | INTRAVENOUS | Status: AC | PRN
  Administered 2024-07-12: 10.7 via INTRAVENOUS

## 2024-07-12 MED ORDER — SODIUM CHLORIDE FLUSH 0.9 % IV SOLN
INTRAVENOUS | Status: AC
  Filled 2024-07-12: qty 10

## 2024-07-12 NOTE — Telephone Encounter (Signed)
     Todd Nelson presented for a Lexiscan  nuclear stress test today.  I Laymon CHRISTELLA Qua, PA-C, provided direct supervision and was present during the stress portion of the study today, which was completed without significant symptoms, immediate complications, or acute ST/T changes on ECG.  Stress imaging is pending at this time.  Preliminary ECG findings may be listed in the chart, but the stress test result will not be finalized until perfusion imaging is complete.  Laymon CHRISTELLA Qua, PA-C  07/12/2024, 9:53 AM

## 2024-07-17 ENCOUNTER — Ambulatory Visit: Attending: Cardiology

## 2024-07-17 DIAGNOSIS — I2510 Atherosclerotic heart disease of native coronary artery without angina pectoris: Secondary | ICD-10-CM | POA: Diagnosis not present

## 2024-07-17 DIAGNOSIS — R0609 Other forms of dyspnea: Secondary | ICD-10-CM | POA: Insufficient documentation

## 2024-07-18 LAB — ECHOCARDIOGRAM COMPLETE
AR max vel: 3.02 cm2
AV Area VTI: 2.98 cm2
AV Area mean vel: 3.38 cm2
AV Mean grad: 7 mmHg
AV Peak grad: 13.4 mmHg
AV Vena cont: 0.3 cm
Ao pk vel: 1.83 m/s
Area-P 1/2: 2.46 cm2
Calc EF: 63 %
MV VTI: 2.35 cm2
P 1/2 time: 976 ms
S' Lateral: 2.8 cm
Single Plane A2C EF: 59.4 %
Single Plane A4C EF: 67.5 %

## 2024-07-25 DIAGNOSIS — R0689 Other abnormalities of breathing: Secondary | ICD-10-CM | POA: Diagnosis not present

## 2024-07-25 DIAGNOSIS — E7800 Pure hypercholesterolemia, unspecified: Secondary | ICD-10-CM | POA: Diagnosis not present

## 2024-07-25 DIAGNOSIS — I10 Essential (primary) hypertension: Secondary | ICD-10-CM | POA: Diagnosis not present

## 2024-07-25 DIAGNOSIS — R0600 Dyspnea, unspecified: Secondary | ICD-10-CM | POA: Diagnosis not present

## 2024-07-25 DIAGNOSIS — Z299 Encounter for prophylactic measures, unspecified: Secondary | ICD-10-CM | POA: Diagnosis not present

## 2024-07-25 DIAGNOSIS — R058 Other specified cough: Secondary | ICD-10-CM | POA: Diagnosis not present

## 2024-08-27 DIAGNOSIS — Z23 Encounter for immunization: Secondary | ICD-10-CM | POA: Diagnosis not present

## 2024-12-19 ENCOUNTER — Ambulatory Visit: Admitting: Cardiology
